# Patient Record
Sex: Female | Born: 1975 | Race: White | Hispanic: No | Smoking: Current some day smoker
Health system: Southern US, Community
[De-identification: ages and names within clinical notes are randomized; demographics above are authoritative.]

## PROBLEM LIST (undated history)

## (undated) DIAGNOSIS — M549 Dorsalgia, unspecified: Secondary | ICD-10-CM

## (undated) DIAGNOSIS — M1990 Unspecified osteoarthritis, unspecified site: Secondary | ICD-10-CM

## (undated) DIAGNOSIS — M797 Fibromyalgia: Secondary | ICD-10-CM

## (undated) DIAGNOSIS — J45909 Unspecified asthma, uncomplicated: Secondary | ICD-10-CM

## (undated) DIAGNOSIS — K219 Gastro-esophageal reflux disease without esophagitis: Secondary | ICD-10-CM

## (undated) DIAGNOSIS — G43909 Migraine, unspecified, not intractable, without status migrainosus: Secondary | ICD-10-CM

## (undated) HISTORY — DX: Migraine, unspecified, not intractable, without status migrainosus: G43.909

## (undated) HISTORY — DX: Gastro-esophageal reflux disease without esophagitis: K21.9

## (undated) HISTORY — DX: Unspecified asthma, uncomplicated: J45.909

## (undated) HISTORY — DX: Dorsalgia, unspecified: M54.9

## (undated) HISTORY — DX: Fibromyalgia: M79.7

## (undated) HISTORY — PX: OTHER SURGICAL HISTORY: SHX169

## (undated) HISTORY — DX: Unspecified osteoarthritis, unspecified site: M19.90

## (undated) HISTORY — PX: KNEE ARTHROSCOPY: SUR90

---

## 2009-07-06 ENCOUNTER — Ambulatory Visit: Payer: Self-pay | Admitting: Internal Medicine

## 2009-10-20 ENCOUNTER — Ambulatory Visit: Payer: Self-pay | Admitting: Family Medicine

## 2011-01-04 ENCOUNTER — Ambulatory Visit: Payer: Self-pay | Admitting: Radiology

## 2011-01-04 ENCOUNTER — Ambulatory Visit: Payer: Self-pay | Admitting: Family Medicine

## 2011-09-05 DIAGNOSIS — M7070 Other bursitis of hip, unspecified hip: Secondary | ICD-10-CM | POA: Insufficient documentation

## 2012-07-08 DIAGNOSIS — Z9889 Other specified postprocedural states: Secondary | ICD-10-CM | POA: Insufficient documentation

## 2012-10-10 DIAGNOSIS — M949 Disorder of cartilage, unspecified: Secondary | ICD-10-CM | POA: Insufficient documentation

## 2012-10-14 DIAGNOSIS — M7630 Iliotibial band syndrome, unspecified leg: Secondary | ICD-10-CM | POA: Insufficient documentation

## 2013-05-08 ENCOUNTER — Emergency Department: Payer: Self-pay | Admitting: Emergency Medicine

## 2013-05-11 ENCOUNTER — Emergency Department: Payer: Self-pay | Admitting: Emergency Medicine

## 2013-05-12 LAB — URINALYSIS, COMPLETE
BLOOD: NEGATIVE
Bacteria: NONE SEEN
Bilirubin,UR: NEGATIVE
GLUCOSE, UR: NEGATIVE mg/dL (ref 0–75)
Ketone: NEGATIVE
LEUKOCYTE ESTERASE: NEGATIVE
Nitrite: NEGATIVE
Ph: 5 (ref 4.5–8.0)
Protein: NEGATIVE
RBC,UR: 2 /HPF (ref 0–5)
Specific Gravity: 1.031 (ref 1.003–1.030)
Squamous Epithelial: 6
WBC UR: 1 /HPF (ref 0–5)

## 2013-07-28 ENCOUNTER — Emergency Department: Payer: Self-pay | Admitting: Emergency Medicine

## 2013-07-29 ENCOUNTER — Emergency Department: Payer: Self-pay | Admitting: Emergency Medicine

## 2013-08-12 ENCOUNTER — Ambulatory Visit: Payer: Self-pay | Admitting: Unknown Physician Specialty

## 2013-08-20 DIAGNOSIS — M9420 Chondromalacia, unspecified site: Secondary | ICD-10-CM | POA: Insufficient documentation

## 2014-05-19 ENCOUNTER — Emergency Department: Payer: Self-pay | Admitting: Emergency Medicine

## 2014-05-25 ENCOUNTER — Ambulatory Visit: Payer: Self-pay | Admitting: Family Medicine

## 2014-06-19 ENCOUNTER — Emergency Department
Admit: 2014-06-19 | Disposition: A | Discharge status: Home / Self Care | Payer: Self-pay | Admitting: Emergency Medicine

## 2014-06-24 DIAGNOSIS — F39 Unspecified mood [affective] disorder: Secondary | ICD-10-CM | POA: Insufficient documentation

## 2014-06-24 DIAGNOSIS — Z8669 Personal history of other diseases of the nervous system and sense organs: Secondary | ICD-10-CM | POA: Insufficient documentation

## 2014-06-24 DIAGNOSIS — F489 Nonpsychotic mental disorder, unspecified: Secondary | ICD-10-CM | POA: Insufficient documentation

## 2014-06-24 DIAGNOSIS — Z8719 Personal history of other diseases of the digestive system: Secondary | ICD-10-CM | POA: Insufficient documentation

## 2014-06-24 DIAGNOSIS — M545 Low back pain: Secondary | ICD-10-CM | POA: Insufficient documentation

## 2014-06-24 DIAGNOSIS — Z8739 Personal history of other diseases of the musculoskeletal system and connective tissue: Secondary | ICD-10-CM | POA: Insufficient documentation

## 2014-06-24 DIAGNOSIS — M797 Fibromyalgia: Secondary | ICD-10-CM | POA: Insufficient documentation

## 2014-06-24 DIAGNOSIS — Z8709 Personal history of other diseases of the respiratory system: Secondary | ICD-10-CM | POA: Insufficient documentation

## 2014-06-24 DIAGNOSIS — F411 Generalized anxiety disorder: Secondary | ICD-10-CM | POA: Insufficient documentation

## 2014-06-24 DIAGNOSIS — M546 Pain in thoracic spine: Secondary | ICD-10-CM

## 2014-06-24 DIAGNOSIS — G4700 Insomnia, unspecified: Secondary | ICD-10-CM | POA: Insufficient documentation

## 2014-06-27 ENCOUNTER — Emergency Department
Admit: 2014-06-27 | Disposition: A | Discharge status: Home / Self Care | Payer: Self-pay | Admitting: Physician Assistant

## 2014-08-04 ENCOUNTER — Emergency Department
Admission: EM | Admit: 2014-08-04 | Discharge: 2014-08-05 | Discharge status: Against Medical Advice | Payer: Medicaid Other | Attending: Emergency Medicine | Admitting: Emergency Medicine

## 2014-08-04 DIAGNOSIS — M25562 Pain in left knee: Secondary | ICD-10-CM | POA: Diagnosis present

## 2014-08-04 DIAGNOSIS — R200 Anesthesia of skin: Secondary | ICD-10-CM | POA: Insufficient documentation

## 2014-08-04 NOTE — ED Notes (Signed)
Pt in with co left thigh pain x 1 week.  Pt is awaiting knee replacement to left knee, called her ortho and he suggested it was related to her back.  States has numbness to outer thigh at times, denies any injury.

## 2014-09-16 ENCOUNTER — Ambulatory Visit: Payer: Self-pay | Admitting: Family Medicine

## 2014-09-16 ENCOUNTER — Encounter: Payer: Self-pay | Admitting: Family Medicine

## 2014-09-16 ENCOUNTER — Telehealth: Payer: Self-pay | Admitting: Family Medicine

## 2014-09-16 NOTE — Telephone Encounter (Signed)
Letter drafted and ready for pick up. Thank you.

## 2014-09-16 NOTE — Telephone Encounter (Signed)
Pt notified that letter was ready for pickup.

## 2014-09-16 NOTE — Telephone Encounter (Signed)
Pt called asking if she could resched appt; when told there was nothing was available, she asked if Dr. Sherley BoundsSundaram could write a letter for her probation officer explaining why she hasn't been able to find a job. Pt states that Sherley BoundsSundaram is familiar with everything going on with her and is able to best explain to her probation officer.

## 2014-11-09 ENCOUNTER — Telehealth: Payer: Self-pay | Admitting: Family Medicine

## 2014-11-09 NOTE — Telephone Encounter (Signed)
Pt has already been treated for the clot. Sorry if I confused you. She just wants to discuss with Dr Sherley Bounds but does want refill on pain meds. I will advise the pt to consult pain management about pain meds.

## 2014-11-09 NOTE — Telephone Encounter (Signed)
Pt wants to know if she can be worked in today for a office visit appt for pain meds and to discuss her situation since she had the clot. I explained to the pt that Dr Oralia Manis schedule is full today and she wants to know if she could be worked in. She states she can get her pain meds tomorrow on 11/10/14 from her pain management but feels she cannot wait til then.

## 2014-11-09 NOTE — Telephone Encounter (Signed)
After consulting with Dr. Sherley Bounds, patient should be advised to go to the ER if she thinks she has a blood clot otherwise she should follow-up with her pain clinic for pain management.

## 2014-11-17 ENCOUNTER — Ambulatory Visit: Payer: Self-pay | Admitting: Family Medicine

## 2014-11-24 ENCOUNTER — Emergency Department
Admission: EM | Admit: 2014-11-24 | Discharge: 2014-11-24 | Disposition: A | Discharge status: Home / Self Care | Payer: Medicaid Other | Attending: Emergency Medicine | Admitting: Emergency Medicine

## 2014-11-24 ENCOUNTER — Emergency Department: Payer: Medicaid Other

## 2014-11-24 ENCOUNTER — Encounter: Payer: Self-pay | Admitting: Emergency Medicine

## 2014-11-24 DIAGNOSIS — Y9389 Activity, other specified: Secondary | ICD-10-CM | POA: Insufficient documentation

## 2014-11-24 DIAGNOSIS — Z79899 Other long term (current) drug therapy: Secondary | ICD-10-CM | POA: Insufficient documentation

## 2014-11-24 DIAGNOSIS — W010XXA Fall on same level from slipping, tripping and stumbling without subsequent striking against object, initial encounter: Secondary | ICD-10-CM | POA: Diagnosis not present

## 2014-11-24 DIAGNOSIS — Z9889 Other specified postprocedural states: Secondary | ICD-10-CM | POA: Diagnosis not present

## 2014-11-24 DIAGNOSIS — S8992XA Unspecified injury of left lower leg, initial encounter: Secondary | ICD-10-CM | POA: Insufficient documentation

## 2014-11-24 DIAGNOSIS — Y998 Other external cause status: Secondary | ICD-10-CM | POA: Insufficient documentation

## 2014-11-24 DIAGNOSIS — M25562 Pain in left knee: Secondary | ICD-10-CM

## 2014-11-24 DIAGNOSIS — Y9289 Other specified places as the place of occurrence of the external cause: Secondary | ICD-10-CM | POA: Insufficient documentation

## 2014-11-24 MED ORDER — HYDROMORPHONE HCL 1 MG/ML IJ SOLN
INTRAMUSCULAR | Status: AC
Start: 2014-11-24 — End: 2014-11-24
  Administered 2014-11-24: 1 mg via INTRAMUSCULAR
  Filled 2014-11-24: qty 1

## 2014-11-24 MED ORDER — HYDROMORPHONE HCL 1 MG/ML IJ SOLN
1.0000 mg | Freq: Once | INTRAMUSCULAR | Status: AC
  Administered 2014-11-24: 1 mg via INTRAMUSCULAR

## 2014-11-24 NOTE — ED Notes (Addendum)
Pt on med hold until 2040. Pt made aware and verbalized understanding.

## 2014-11-24 NOTE — ED Notes (Signed)
Patient to ED with c/o left knee pain, reports helping with pumping gas in car and fell landing on knee that she had previously injured.

## 2014-11-24 NOTE — ED Provider Notes (Signed)
[facility] Emergency Department Provider Note  Time seen: 8:05 PM  I have reviewed the triage vital signs and the nursing notes.   HISTORY  Chief Complaint Knee Pain    HPI Sabrina Drake is a 39 y.o. female with a past medical history of asthma, back pain, migraines, fibromyalgia, multiple knee surgeries who presents the emergency department left knee pain. According to the patient patient tripped and fell landing on the left knee. 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 Patient is currently nonweightbearing. Patient states increased pain since falling and landing on the knee. Denies any other injuries. Denies hitting her head, or loss of consciousness. Describes her pain as moderate to severe, worse with any type of palpation or movement.     Past Medical History  Diagnosis Date  . Asthma   . Back pain   . Migraine headache   . Fibromyalgia   . Esophageal reflux   . Arthritis     Patient Active Problem List   Diagnosis Date Noted  . Insomnia, persistent 06/24/2014  . Anxiety, generalized 06/24/2014  . 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 Affective disorder 06/24/2014  . H/O arthritis 06/24/2014  . History of asthma 06/24/2014  . Back pain, chronic 06/24/2014  . H/O gastrointestinal disease 06/24/2014  . Fibrositis 06/24/2014  . Fibromyalgia 06/24/2014  . History of migraine headaches 06/24/2014    Past Surgical History  Procedure Laterality Date  . Knee arthroscopy      Current Outpatient Rx  Name  Route  Sig  Dispense  Refill  . ALPRAZolam (XANAX) 0.5 MG tablet   Oral   Take by mouth as needed. 2 in am, 1 in pm tablet two times daily, as needed         . DULoxetine (CYMBALTA) 60 MG capsule   Oral   Take 1 capsule by mouth daily.         Marland Kitchen gabapentin (NEURONTIN) 300 MG capsule   Oral   Take 1 capsule by mouth 3 (three) times daily.         Marland Kitchen omeprazole (PRILOSEC) 40 MG capsule  Oral   Take 1 capsule by mouth daily.         . Oxycodone HCl 10 MG TABS   Oral   Take 1 tablet by mouth every 4 (four) hours.         Marland Kitchen zolpidem (AMBIEN) 10 MG tablet   Oral   Take 1 tablet by mouth as needed. 1 tablet at bedtime, as needed           Allergies Frovatriptan; Shellfish allergy; Sumatriptan; and Zolmitriptan  Family History  Problem Relation Age of Onset  . Hypertension Mother   . Skin cancer Mother   . Heart disease Father   . Hypertension Father   . Heart disease Maternal Uncle   . Heart disease Maternal Grandfather   . Breast cancer Maternal Grandmother   . Depression Maternal Grandmother   . Stomach cancer Paternal Grandfather     Social History Social History  Substance Use Topics  . Smoking status: Never Smoker   . Smokeless tobacco: None  . Alcohol Use: No    Review of Systems Constitutional: Negative for fever. Cardiovascular: Negative for chest pain. Respiratory: Negative for shortness of breath. Gastrointestinal: Negative for abdominal pain Musculoskeletal: Positive left knee pain 10-point ROS otherwise negative.  ____________________________________________   PHYSICAL EXAM:  VITAL SIGNS: ED Triage Vitals  Enc Vitals  Group     BP 11/24/14 1856 165/100 mmHg     Pulse Rate 11/24/14 1856 83     Resp 11/24/14 1856 20     Temp 11/24/14 1856 98.1 F (36.7 C)     Temp Source 11/24/14 1856 Oral     SpO2 11/24/14 1856 100 %     Weight 11/24/14 1856 190 lb (86.183 kg)     Height 11/24/14 1856  (1.549 m)     Head Cir --      Peak Flow --      Pain Score 11/24/14 1857 9     Pain Loc --      Pain Edu? --      Excl. in GC? --     Constitutional: Alert and oriented. Well appearing and in no distress. Eyes: Normal exam ENT   Head: Normocephalic and atraumatic. Cardiovascular: Normal rate, regular rhythm. No murmur Respiratory: Normal respiratory effort without tachypnea nor retractions. Breath sounds are clear and equal  bilaterally. No wheezes/rales/rhonchi. Gastrointestinal: Soft and nontender. No distention.   Musculoskeletal: Patient with recent surgical incision to left knee, well healing. Mild to moderate tenderness of left knee and popliteal fossa. Neurovascularly intact distally with 2+ DP pulse. Neurologic:  Normal speech and language. No gross focal neurologic deficits  Skin:  Skin is warm, dry and intact. Well appearing healing surgical incision of the left knee. Psychiatric: Mood and affect are normal. Speech and behavior are normal.  ____________________________________________    RADIOLOGY  1. Ovoid subchondral sclerosis in the medial left femoral condyle with a thin surrounding rim of lucency and slight depression of the overlying articular cortex, most in keeping with an osteochondral lesion at the site of the previously demonstrated cartilage injury on the 08/12/2013 MRI study. The radiographic findings could indicate an unstable osteochondral lesion, and a follow-up left knee MRI is recommended for further evaluation. 2. Small suprapatellar knee joint effusion. Otherwise no acute fracture or malalignment in the left knee.   INITIAL IMPRESSION / ASSESSMENT AND PLAN / ED COURSE  Pertinent labs & imaging results that were available during my care of the patient were reviewed by me and considered in my medical decision making (see chart for details).  Patient with increased left knee pain after a fall. Patient is 4 weeks status post transplant of cartilage. X-ray findings are likely related to her recent cartilage transplant. Regardless I discussed these results with the patient and she is to [facility]-up [facility] tomorrow. She is agreeable to plan. We will continue nonweightbearing status. Patient takes 15 mg oxycodone every 8 hours at home already, I do not feel safe adding any additional narcotic pain medications to this regimen. We will however dose a one-time dose of IM pain  medication while under emergency department observation.  ____________________________________________   FINAL CLINICAL IMPRESSION(S) / ED DIAGNOSES  Left knee pain   Minna Antis, MD 11/24/14 2009

## 2014-11-24 NOTE — Discharge Instructions (Signed)
Please follow-up with [facility] tomorrow. Your x-ray results are as shown below: 1. Ovoid subchondral sclerosis in the medial left femoral condyle with a thin surrounding rim of lucency and slight depression of the overlying articular cortex, most in keeping with an osteochondral lesion at the site of the previously demonstrated cartilage injury on the 08/12/2013 MRI study. The radiographic findings could indicate an unstable osteochondral lesion, and a follow-up left knee MRI is recommended for further evaluation. 2. Small suprapatellar knee joint effusion. Otherwise no acute fracture or malalignment in the left knee.  Knee Pain The knee is the complex joint between your thigh and your lower leg. It is made up of bones, tendons, ligaments, and cartilage. The bones that make up the knee are:  The femur in the thigh.  The tibia and fibula in the lower leg.  The patella or kneecap riding in the groove on the lower femur. CAUSES  Knee pain is a common complaint with many causes. A few of these causes are:  Injury, such as:  A ruptured ligament or tendon injury.  Torn cartilage.  Medical conditions, such as:  Gout  Arthritis  Infections  Overuse, over training, or overdoing a physical activity. Knee pain can be minor or severe. Knee pain can accompany debilitating injury. Minor knee problems often respond well to self-care measures or get well on their own. More serious injuries may need medical intervention or even surgery. SYMPTOMS The knee is complex. Symptoms of knee problems can vary widely. Some of the problems are:  Pain with movement and weight bearing.  Swelling and tenderness.  Buckling of the knee.  Inability to straighten or extend your knee.  Your knee locks and you cannot straighten it.  Warmth and redness with pain and fever.  Deformity or dislocation of the kneecap. DIAGNOSIS  Determining what is wrong may be very straight forward such as  when there is an injury. It can also be challenging because of the complexity of the knee. Tests to make a diagnosis may include:  Your caregiver taking a history and doing a physical exam.  Routine X-rays can be used to rule out other problems. X-rays will not reveal a cartilage tear. Some injuries of the knee can be diagnosed by:  Arthroscopy a surgical technique by which a small video camera is inserted through tiny incisions on the sides of the knee. This procedure is used to examine and repair internal knee joint problems. Tiny instruments can be used during arthroscopy to repair the torn knee cartilage (meniscus).  Arthrography is a radiology technique. A contrast liquid is directly injected into the knee joint. Internal structures of the knee joint then become visible on X-ray film.  An MRI scan is a non X-ray radiology procedure in which magnetic fields and a computer produce two- or three-dimensional images of the inside of the knee. Cartilage tears are often visible using an MRI scanner. MRI scans have largely replaced arthrography in diagnosing cartilage tears of the knee.  Blood work.  Examination of the fluid that helps to lubricate the knee joint (synovial fluid). This is done by taking a sample out using a needle and a syringe. TREATMENT The treatment of knee problems depends on the cause. Some of these treatments are:  Depending on the injury, proper casting, splinting, surgery, or physical therapy care will be needed.  Give yourself adequate recovery time. Do not overuse your joints. If you begin to get sore during workout routines, back off. Slow down or  do fewer repetitions.  For repetitive activities such as cycling or running, maintain your strength and nutrition.  Alternate muscle groups. For example, if you are a weight lifter, work the upper body on one day and the lower body the next.  Either tight or weak muscles do not give the proper support for your knee. Tight  or weak muscles do not absorb the stress placed on the knee joint. Keep the muscles surrounding the knee strong.  Take care of mechanical problems.  If you have flat feet, orthotics or special shoes may help. See your caregiver if you need help.  Arch supports, sometimes with wedges on the inner or outer aspect of the heel, can help. These can shift pressure away from the side of the knee most bothered by osteoarthritis.  A brace called an "unloader" brace also may be used to help ease the pressure on the most arthritic side of the knee.  If your caregiver has prescribed crutches, braces, wraps or ice, use as directed. The acronym for this is PRICE. This means protection, rest, ice, compression, and elevation.  Nonsteroidal anti-inflammatory drugs (NSAIDs), can help relieve pain. But if taken immediately after an injury, they may actually increase swelling. Take NSAIDs with food in your stomach. Stop them if you develop stomach problems. Do not take these if you have a history of ulcers, stomach pain, or bleeding from the bowel. Do not take without your caregiver's approval if you have problems with fluid retention, heart failure, or kidney problems.  For ongoing knee problems, physical therapy may be helpful.  Glucosamine and chondroitin are over-the-counter dietary supplements. Both may help relieve the pain of osteoarthritis in the knee. These medicines are different from the usual anti-inflammatory drugs. Glucosamine may decrease the rate of cartilage destruction.  Injections of a corticosteroid drug into your knee joint may help reduce the symptoms of an arthritis flare-up. They may provide pain relief that lasts a few months. You may have to wait a few months between injections. The injections do have a small increased risk of infection, water retention, and elevated blood sugar levels.  Hyaluronic acid injected into damaged joints may ease pain and provide lubrication. These injections may  work by reducing inflammation. A series of shots may give relief for as long as 6 months.  Topical painkillers. Applying certain ointments to your skin may help relieve the pain and stiffness of osteoarthritis. Ask your pharmacist for suggestions. Many over the-counter products are approved for temporary relief of arthritis pain.  In some countries, doctors often prescribe topical NSAIDs for relief of chronic conditions such as arthritis and tendinitis. A review of treatment with NSAID creams found that they worked as well as oral medications but without the serious side effects. PREVENTION  Maintain a healthy weight. Extra pounds put more strain on your joints.  Get strong, stay limber. Weak muscles are a common cause of knee injuries. Stretching is important. Include flexibility exercises in your workouts.  Be smart about exercise. If you have osteoarthritis, chronic knee pain or recurring injuries, you may need to change the way you exercise. This does not mean you have to stop being active. If your knees ache after jogging or playing basketball, consider switching to swimming, water aerobics, or other low-impact activities, at least for a few days a week. Sometimes limiting high-impact activities will provide relief.  Make sure your shoes fit well. Choose footwear that is right for your sport.  Protect your knees. Use the proper gear  for knee-sensitive activities. Use kneepads when playing volleyball or laying carpet. Buckle your seat belt every time you drive. Most shattered kneecaps occur in car accidents.  Rest when you are tired. SEEK MEDICAL CARE IF:  You have knee pain that is continual and does not seem to be getting better.  SEEK IMMEDIATE MEDICAL CARE IF:  Your knee joint feels hot to the touch and you have a high fever. MAKE SURE YOU:   Understand these instructions.  Will watch your condition.  Will get help right away if you are not doing well or get worse. Document  Released: 01/01/2007 Document Revised: 05/29/2011 Document Reviewed: 01/01/2007 [facility] Patient Information 2015 [place], [place]. This information is not intended to replace advice given to you by your health care provider. Make sure you discuss any questions you have with your health care provider.

## 2014-11-24 NOTE — ED Notes (Signed)
Pt requesting more pain meds at this time. RN spoke to MD who verbalized no further orders at this time

## 2014-12-29 ENCOUNTER — Encounter: Payer: Self-pay | Admitting: Emergency Medicine

## 2014-12-29 ENCOUNTER — Emergency Department: Payer: Medicaid Other

## 2014-12-29 ENCOUNTER — Emergency Department
Admission: EM | Admit: 2014-12-29 | Discharge: 2014-12-29 | Disposition: A | Discharge status: Home / Self Care | Payer: Medicaid Other | Attending: Emergency Medicine | Admitting: Emergency Medicine

## 2014-12-29 DIAGNOSIS — Z79899 Other long term (current) drug therapy: Secondary | ICD-10-CM | POA: Insufficient documentation

## 2014-12-29 DIAGNOSIS — Y92009 Unspecified place in unspecified non-institutional (private) residence as the place of occurrence of the external cause: Secondary | ICD-10-CM | POA: Diagnosis not present

## 2014-12-29 DIAGNOSIS — Y9389 Activity, other specified: Secondary | ICD-10-CM | POA: Diagnosis not present

## 2014-12-29 DIAGNOSIS — Y998 Other external cause status: Secondary | ICD-10-CM | POA: Diagnosis not present

## 2014-12-29 DIAGNOSIS — W01198A Fall on same level from slipping, tripping and stumbling with subsequent striking against other object, initial encounter: Secondary | ICD-10-CM | POA: Insufficient documentation

## 2014-12-29 DIAGNOSIS — S8992XA Unspecified injury of left lower leg, initial encounter: Secondary | ICD-10-CM | POA: Diagnosis present

## 2014-12-29 DIAGNOSIS — S8002XA Contusion of left knee, initial encounter: Secondary | ICD-10-CM | POA: Insufficient documentation

## 2014-12-29 DIAGNOSIS — Z79891 Long term (current) use of opiate analgesic: Secondary | ICD-10-CM | POA: Diagnosis not present

## 2014-12-29 MED ORDER — OXYCODONE-ACETAMINOPHEN 5-325 MG PO TABS
2.0000 | ORAL_TABLET | Freq: Once | ORAL | Status: AC
  Administered 2014-12-29: 2 via ORAL
  Filled 2014-12-29: qty 2

## 2014-12-29 NOTE — ED Notes (Signed)
Patient discharged to home per MD order. Patient in stable condition, and deemed medically cleared by ED provider for discharge. Discharge instructions reviewed with patient/family using "Teach Back"; verbalized understanding of medication education and administration, and information about follow-up care. Denies further concerns. ° °

## 2014-12-29 NOTE — ED Provider Notes (Signed)
[facility] Emergency Department Provider Note ____________________________________________  Time seen: 2310  I have reviewed the triage vital signs and the nursing notes.  HISTORY  Chief Complaint  Knee Pain  HPI Sabrina Drake is a 39 y.o. female reports to the ED for evaluation and management of pain to the left knee after a fall this morning. She describes she was at home, and tripped over a rug, causing her to fall twisting the left knee and then landed on it. She does report to wearing a Velcro knee sleeve as she is 8 weeks status post surgery to the left knee. 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 She notes the knee has been "catching" since the injury. She rates her pain at 8/10 in triage. She is on daily pain medicines which include oxycodone 10 mg and OxyContin 15 mg. She claims that she has recently left her pain pills at her parents house and may have been. She claims she is not able to retrieve those pills today. Review of the [place] controlled substance database, reveals that she received a refill of #100 oxycodone on 12/22/14 and #60 OxyContin on 11/21/14, from her provider.  Past Medical History  Diagnosis Date  . Asthma   . Back pain   . Migraine headache   . Fibromyalgia   . Esophageal reflux   . Arthritis     Patient Active Problem List   Diagnosis Date Noted  . Insomnia, persistent 06/24/2014  . Anxiety, generalized 06/24/2014  . 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 Affective disorder (HCC) 06/24/2014  . H/O arthritis 06/24/2014  . History of asthma 06/24/2014  . Back pain, chronic 06/24/2014  . H/O gastrointestinal disease 06/24/2014  . Fibrositis 06/24/2014  . Fibromyalgia 06/24/2014  . History of migraine headaches 06/24/2014    Past Surgical History  Procedure Laterality Date  . Knee arthroscopy      Current Outpatient Rx  Name  Route   Sig  Dispense  Refill  . ALPRAZolam (XANAX) 0.5 MG tablet   Oral   Take by mouth as needed. 2 in am, 1 in pm tablet two times daily, as needed         . DULoxetine (CYMBALTA) 60 MG capsule   Oral   Take 1 capsule by mouth daily.         Marland Kitchen gabapentin (NEURONTIN) 300 MG capsule   Oral   Take 1 capsule by mouth 3 (three) times daily.         Marland Kitchen omeprazole (PRILOSEC) 40 MG capsule   Oral   Take 1 capsule by mouth daily.         . Oxycodone HCl 10 MG TABS   Oral   Take 1 tablet by mouth every 4 (four) hours.         Marland Kitchen zolpidem (AMBIEN) 10 MG tablet   Oral   Take 1 tablet by mouth as needed. 1 tablet at bedtime, as needed           Allergies Frovatriptan; Shellfish allergy; Sumatriptan; Toradol; Tramadol; and Zolmitriptan  Family History  Problem Relation Age of Onset  . Hypertension Mother   . Skin cancer Mother   . Heart disease Father   . Hypertension Father   . Heart disease Maternal Uncle   . Heart disease Maternal Grandfather   . Breast cancer Maternal Grandmother   .  Depression Maternal Grandmother   . Stomach cancer Paternal Grandfather    Social History Social History  Substance Use Topics  . Smoking status: Never Smoker   . Smokeless tobacco: None  . Alcohol Use: No   Review of Systems  Constitutional: Negative for fever. Eyes: Negative for visual changes. ENT: Negative for sore throat. Cardiovascular: Negative for chest pain. Respiratory: Negative for shortness of breath. Gastrointestinal: Negative for abdominal pain, vomiting and diarrhea. Genitourinary: Negative for dysuria. Musculoskeletal: Negative for back pain. Left knee pain as above. Skin: Negative for rash. Neurological: Negative for headaches, focal weakness or numbness. ____________________________________________  PHYSICAL EXAM:  VITAL SIGNS: ED Triage Vitals  Enc Vitals Group     BP 12/29/14 2256 146/82 mmHg     Pulse Rate 12/29/14 2256 71     Resp 12/29/14 2256 20      Temp 12/29/14 2256 97.8 F (36.6 C)     Temp src --      SpO2 12/29/14 2256 100 %     Weight 12/29/14 2256 200 lb (90.719 kg)     Height 12/29/14 2256  (1.575 m)     Head Cir --      Peak Flow --      Pain Score 12/29/14 2308 8     Pain Loc --      Pain Edu? --      Excl. in GC? --    Constitutional: Alert and oriented. Well appearing and in no distress. Head: Normocephalic and atraumatic.      Eyes: Conjunctivae are normal. PERRL. Normal extraocular movements      Ears: Canals clear. TMs intact bilaterally.   Nose: No congestion/rhinorrhea.   Mouth/Throat: Mucous membranes are moist.   Neck: Supple. No thyromegaly. Hematological/Lymphatic/Immunological: No cervical lymphadenopathy. Cardiovascular: Normal rate, regular rhythm. Normal distal pulses Respiratory: Normal respiratory effort. No wheezes/rales/rhonchi. Gastrointestinal: Soft and nontender. No distention. Musculoskeletal: Left knee with well-healed medial surgical scar without local abrasion, edema, or erythema. Patient with full active range of motion of the left knee with flexion and extension. Nontender with normal range of motion in all extremities.  Neurologic:  Normal gait without ataxia. Normal speech and language. No gross focal neurologic deficits are appreciated. Skin:  Skin is warm, dry and intact. No rash noted. 1+ pitting edema bilaterally from the ankle. Psychiatric: Mood and affect are normal. Patient exhibits appropriate insight and judgment. ____________________________________________   RADIOLOGY Left Knee IMPRESSION: Medial femoral condyle osteochondral defect re- demonstrated. No new osseous abnormality identified at the left knee.  I, [name], [name], personally viewed and evaluated these images (plain radiographs) as part of my medical decision making.  ____________________________________________  PROCEDURES  Roxicet 5-325 mg 2 tabs  PO ____________________________________________  INITIAL IMPRESSION / ASSESSMENT AND PLAN / ED COURSE  Patient with a left knee contusion, status post left knee surgery. No radiographic evidence of fracture, effusion or dislocation. Patient will be discharged with instructions to dose her home medications as previously prescribed. She is to rest, ice, elevate the knee and wear her knee brace as appropriate. Follow-up with orthopedics provider as scheduled.  ____________________________________________  FINAL CLINICAL IMPRESSION(S) / ED DIAGNOSES  Final diagnoses:  Knee contusion, left, initial encounter      Lissa Hoard, PA-C 12/29/14 2347  Jennye Moccasin, MD 01/04/15 [phone]

## 2014-12-29 NOTE — ED Notes (Signed)
Pt presents to ED with left knee brace in which she had left knee surgery 10/26/2014.  Pt tripped today and knee has been "catching" since. 8/10 pain

## 2014-12-29 NOTE — ED Notes (Signed)
Pt in with co left knee pain, states she fell this am onto knee.  Recently had 3rd surgery to same knee in august, and since fall states she feels a "popping" and has increased pain.

## 2014-12-29 NOTE — Discharge Instructions (Signed)
° °  Your exam and x-rays are unchanged from last month. Continue to dose your home medicines for pain relief.  Follow-up with Dr. Regenia Skeeter as scheduled for further evaluation. Apply ice and rest with the leg elevated to reduce pain and swelling.

## 2015-01-04 ENCOUNTER — Emergency Department
Admission: EM | Admit: 2015-01-04 | Discharge: 2015-01-04 | Disposition: A | Discharge status: Home / Self Care | Payer: Medicaid Other | Attending: Emergency Medicine | Admitting: Emergency Medicine

## 2015-01-04 ENCOUNTER — Encounter: Payer: Self-pay | Admitting: Emergency Medicine

## 2015-01-04 DIAGNOSIS — Z79899 Other long term (current) drug therapy: Secondary | ICD-10-CM | POA: Diagnosis not present

## 2015-01-04 DIAGNOSIS — K0889 Other specified disorders of teeth and supporting structures: Secondary | ICD-10-CM | POA: Diagnosis present

## 2015-01-04 DIAGNOSIS — Z792 Long term (current) use of antibiotics: Secondary | ICD-10-CM | POA: Insufficient documentation

## 2015-01-04 DIAGNOSIS — K047 Periapical abscess without sinus: Secondary | ICD-10-CM

## 2015-01-04 MED ORDER — OXYCODONE-ACETAMINOPHEN 5-325 MG PO TABS
1.0000 | ORAL_TABLET | Freq: Once | ORAL | Status: AC
  Administered 2015-01-04: 1 via ORAL
  Filled 2015-01-04: qty 1

## 2015-01-04 MED ORDER — AMOXICILLIN-POT CLAVULANATE 875-125 MG PO TABS: 1.0000 | ORAL_TABLET | Freq: Two times a day (BID) | ORAL | Status: DC

## 2015-01-04 NOTE — ED Provider Notes (Signed)
[facility] Emergency Department Provider Note  ____________________________________________  Time seen: Approximately 7:30 PM  I have reviewed the triage vital signs and the nursing notes.   HISTORY  Chief Complaint Dental Pain    HPI Sabrina Drake is a 39 y.o. female presents to the emergency department with a known dental abscess to second molar left lower jaw. She states she was evaluated by her dentist was placed on amoxicillin for that infection" is getting worse". She states that she tried calling back to her dentist and was unable to receive a response this afternoon for new antibiotic. Posterior have a root canal done but states that she is on blood thinners and has to have medical clearance prior to that procedure being done. States that she wants and a change in her antibiotics.   Of note, the patient was queried in the [place] controlled substance database. Database reveals that in the last 2 weeks she has filled prescriptions for OxyContin No. 60 on 12/30/2014 and oxycodone No. 100 on 12/22/2014.   Past Medical History  Diagnosis Date  . Asthma   . Back pain   . Migraine headache   . Fibromyalgia   . Esophageal reflux   . Arthritis     Patient Active Problem List   Diagnosis Date Noted  . Insomnia, persistent 06/24/2014  . Anxiety, generalized 06/24/2014  . 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 Affective disorder (HCC) 06/24/2014  . H/O arthritis 06/24/2014  . History of asthma 06/24/2014  . Back pain, chronic 06/24/2014  . H/O gastrointestinal disease 06/24/2014  . Fibrositis 06/24/2014  . Fibromyalgia 06/24/2014  . History of migraine headaches 06/24/2014    Past Surgical History  Procedure Laterality Date  . Knee arthroscopy      Current Outpatient Rx  Name  Route  Sig  Dispense  Refill  . ALPRAZolam (XANAX) 0.5 MG tablet   Oral   Take by mouth as needed. 2 in am, 1 in pm tablet two times daily, as needed          . amoxicillin-clavulanate (AUGMENTIN) 875-125 MG tablet   Oral   Take 1 tablet by mouth 2 (two) times daily.   14 tablet   0   . DULoxetine (CYMBALTA) 60 MG capsule   Oral   Take 1 capsule by mouth daily.         Marland Kitchen gabapentin (NEURONTIN) 300 MG capsule   Oral   Take 1 capsule by mouth 3 (three) times daily.         Marland Kitchen omeprazole (PRILOSEC) 40 MG capsule   Oral   Take 1 capsule by mouth daily.         . Oxycodone HCl 10 MG TABS   Oral   Take 1 tablet by mouth every 4 (four) hours.         Marland Kitchen zolpidem (AMBIEN) 10 MG tablet   Oral   Take 1 tablet by mouth as needed. 1 tablet at bedtime, as needed           Allergies Frovatriptan; Shellfish allergy; Sumatriptan; Toradol; Tramadol; and Zolmitriptan  Family History  Problem Relation Age of Onset  . Hypertension Mother   . Skin cancer Mother   . Heart disease Father   . Hypertension Father   . Heart disease Maternal Uncle   . Heart disease Maternal Grandfather   . Breast cancer Maternal Grandmother   . Depression Maternal Grandmother   . Stomach cancer Paternal Grandfather  Social History Social History  Substance Use Topics  . Smoking status: Never Smoker   . Smokeless tobacco: None  . Alcohol Use: No    Review of Systems Constitutional: No fever/chills Eyes: No visual changes. ENT: No sore throat. The patient endorses left-sided lower dental pain. Cardiovascular: Denies chest pain. Respiratory: Denies shortness of breath. Gastrointestinal: No abdominal pain.  No nausea, no vomiting.  No diarrhea.  No constipation. Genitourinary: Negative for dysuria. Musculoskeletal: Negative for back pain. Skin: Negative for rash. Neurological: Negative for headaches, focal weakness or numbness.  10-point ROS otherwise negative.  ____________________________________________   PHYSICAL EXAM:  VITAL SIGNS: ED Triage Vitals  Enc Vitals Group     BP 01/04/15 1842 133/91 mmHg     Pulse Rate 01/04/15 1842  72     Resp 01/04/15 1842 18     Temp 01/04/15 1842 98.4 F (36.9 C)     Temp Source 01/04/15 1842 Oral     SpO2 01/04/15 1842 99 %     Weight 01/04/15 1842 195 lb (88.451 kg)     Height 01/04/15 1842  (1.575 m)     Head Cir --      Peak Flow --      Pain Score 01/04/15 1844 7     Pain Loc --      Pain Edu? --      Excl. in GC? --     Constitutional: Alert and oriented. Well appearing and in no acute distress. Eyes: Conjunctivae are normal. PERRL. EOMI. Head: Atraumatic. Nose: No congestion/rhinnorhea. Mouth/Throat: Mucous membranes are moist.  Oropharynx non-erythematous. There is erythema and edema surrounding the second molar on the left lower jaw. Drainage noted. No loose dentition. Neck: No stridor.   Hematological/Lymphatic/Immunilogical: No cervical lymphadenopathy. Cardiovascular: Normal rate, regular rhythm. Grossly normal heart sounds.  Good peripheral circulation. Respiratory: Normal respiratory effort.  No retractions. Lungs CTAB. Gastrointestinal: Soft and nontender. No distention. No abdominal bruits. No CVA tenderness. Musculoskeletal: No lower extremity tenderness nor edema.  No joint effusions. Neurologic:  Normal speech and language. No gross focal neurologic deficits are appreciated. No gait instability. Skin:  Skin is warm, dry and intact. No rash noted. Psychiatric: Mood and affect are normal. Speech and behavior are normal.  ____________________________________________   LABS (all labs ordered are listed, but only abnormal results are displayed)  Labs Reviewed - No data to display ____________________________________________  EKG   ____________________________________________  RADIOLOGY   ____________________________________________   PROCEDURES  Procedure(s) performed: None  Critical Care performed: No  ____________________________________________   INITIAL IMPRESSION / ASSESSMENT AND PLAN / ED COURSE  Pertinent labs & imaging  results that were available during my care of the patient were reviewed by me and considered in my medical decision making (see chart for details).  The patient presents emergency Department with a known dental abscess and is waiting for medical clearance from her PCP for root canal. She states that the antibiotic she is placed on, amoxicillin, has been ineffective in treating the infection. He tried calling her dentist with no answer today. I will change the patient's antibiotic from amoxicillin to Augmentin and explained the differences with the patient. She verbalizes understanding of same. I will give 1 dose of narcotic pain medication in the emergency department for pain but advised the patient I would not send her home with any pain medications. The patient verbalized understanding of same. ____________________________________________   FINAL CLINICAL IMPRESSION(S) / ED DIAGNOSES  Final diagnoses:  Dental abscess  Racheal PatchesJonathan D Cuthriell, PA-C 01/04/15 1945  Myrna Blazeravid Matthew Schaevitz, MD 01/04/15 [phone]

## 2015-01-04 NOTE — Discharge Instructions (Signed)

## 2015-01-04 NOTE — ED Notes (Signed)
States L sided dental pain, cannot have tooth pulled until done with course of Xaralto for DVT

## 2015-01-13 ENCOUNTER — Encounter (INDEPENDENT_AMBULATORY_CARE_PROVIDER_SITE_OTHER): Payer: Self-pay

## 2015-01-13 ENCOUNTER — Encounter: Payer: Self-pay | Admitting: Family Medicine

## 2015-01-13 ENCOUNTER — Ambulatory Visit (INDEPENDENT_AMBULATORY_CARE_PROVIDER_SITE_OTHER): Payer: Medicaid Other | Admitting: Family Medicine

## 2015-01-13 VITALS — BP 126/78 | HR 63 | Temp 98.4°F | Resp 18 | Wt 203.4 lb

## 2015-01-13 DIAGNOSIS — I82402 Acute embolism and thrombosis of unspecified deep veins of left lower extremity: Secondary | ICD-10-CM

## 2015-01-13 DIAGNOSIS — K047 Periapical abscess without sinus: Secondary | ICD-10-CM | POA: Insufficient documentation

## 2015-01-13 DIAGNOSIS — M545 Low back pain: Secondary | ICD-10-CM

## 2015-01-13 DIAGNOSIS — Z86718 Personal history of other venous thrombosis and embolism: Secondary | ICD-10-CM | POA: Insufficient documentation

## 2015-01-13 DIAGNOSIS — M546 Pain in thoracic spine: Secondary | ICD-10-CM

## 2015-01-13 NOTE — Progress Notes (Signed)
Name: Sabrina Drake   MRN: [id number]    DOB: 01-23-1976   Date:01/13/2015       Progress Note  Subjective  Chief Complaint  Chief Complaint  Patient presents with  . Medical Clearance    patient is here to get clearance for a tooth extraction.   . Advice Only    patient needs MRI    HPI  Sabrina Drake is a 39 y.o. female with a past medical history of back pain, migraines, fibromyalgia, multiple knee surgeries, history of incarceration in 2015, opioid dependency presents today with a known dental abscess to second molar left lower jaw requiring surgical clearance prior to dental procedure. She states she was evaluated by her dentist was placed on amoxicillin for that infection. Posterior have a root canal done but states that she is on blood thinners and has to have medical clearance prior to that procedure being done. 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 Madelaine EtienneSolic. On 11/01/14 a doppler US of LE was done showing:  Summary of Findings  Right No evidence of deep venous obstruction in the lower extremity. No indirect evidence of obstruction proximal to the inguinal ligament.  Left Evidence of acute obstruction in the posterior tibial vein and peroneal vein.  She was started on Xarelto 20 mg a day end of August 2016 and has been on it for 2 months now, proposed duration 3-6 months. But today she is willing to come off of the medication for advised time to proceed with much needed dental procedure as she has had recurrent oral infections for over 1 month now.  Of note, the patient was [place] the [place] controlled substance [place]. Database reveals that in the last 2 weeks she has filled prescriptions for Oxycodone 10mg  No. 12 on 01/12/15 and Oxycontin 15mg  No. 60 on 12/30/2014 and Oxycodone 10mg  No. 100 on 12/22/2014. Prescribed by pain management specialist, Dr. Rochele RaringSunil Dogra. She is due to  follow up with him soon and is going to discuss her chronic thoracic and lumbar back pain. She complains of muscle stiffness and more weakness in her legs with occasional numbness over the past few months and is requesting an MRI of her thoracic and lumbar spine today. She is interested in pursuing neurosurgical consultation if MRI findings indicate the need.  Past Medical History  Diagnosis Date  . Asthma   . Back pain   . Migraine headache   . Fibromyalgia   . Esophageal reflux   . Arthritis     Patient Active Problem List   Diagnosis Date Noted  . Dental abscess 01/13/2015  . DVT (deep venous thrombosis), left 01/13/2015  . Insomnia, persistent 06/24/2014  . Anxiety, generalized 06/24/2014  . 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 Affective disorder (HCC) 06/24/2014  . H/O arthritis 06/24/2014  . History of asthma 06/24/2014  . Thoracolumbar back pain 06/24/2014  . H/O gastrointestinal disease 06/24/2014  . Fibrositis 06/24/2014  . Fibromyalgia 06/24/2014  . History of migraine headaches 06/24/2014  . 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 Iliotibial band syndrome 10/14/2012  . Cartilage disease 10/10/2012  . Gonalgia 07/08/2012  . Bursitis of hip 09/05/2011    Social History  Substance Use Topics  . Smoking status: Never Smoker   . Smokeless tobacco: Not on file  . Alcohol Use: No     Current outpatient prescriptions:  .  ALPRAZolam (XANAX) 0.5 MG tablet, Take by mouth as needed. 2 in  am, 1 in pm tablet two times daily, as needed, Disp: , Rfl:  .  DULoxetine (CYMBALTA) 60 MG capsule, Take 1 capsule by mouth daily., Disp: , Rfl:  .  LYRICA 75 MG capsule, Take 75 mg by mouth 2 (two) times daily., Disp: , Rfl: 5 .  omeprazole (PRILOSEC) 40 MG capsule, Take 1 capsule by mouth daily., Disp: , Rfl:  .  Oxycodone HCl 10 MG TABS, Take 1 tablet by mouth every 4 (four) hours., Disp: , Rfl:  .  OXYCONTIN 15 MG T12A 12 hr tablet, Take 15 mg by mouth 2 (two) times daily., Disp: , Rfl: 0 .   XARELTO 20 MG TABS tablet, Take 1 tablet by mouth daily., Disp: , Rfl: 2 .  zolpidem (AMBIEN) 10 MG tablet, Take 1 tablet by mouth as needed. 1 tablet at bedtime, as needed, Disp: , Rfl:   Past Surgical History  Procedure Laterality Date  . Knee arthroscopy      Family History  Problem Relation Age of Onset  . Hypertension Mother   . Skin cancer Mother   . Heart disease Father   . Hypertension Father   . Heart disease Maternal Uncle   . Heart disease Maternal Grandfather   . Breast cancer Maternal Grandmother   . Depression Maternal Grandmother   . Stomach cancer Paternal Grandfather     Allergies  Allergen Reactions  . Frovatriptan   . Shellfish Allergy Nausea And Vomiting  . Sumatriptan   . Toradol [Ketorolac Tromethamine] Swelling  . Tramadol Nausea And Vomiting  . Zolmitriptan      Review of Systems  CONSTITUTIONAL: No significant weight changes, fever, chills, weakness or fatigue.  HEENT:  - Eyes: No visual changes.  - Ears: No auditory changes. No pain.  - Nose: No sneezing, congestion, runny nose. - Throat: No sore throat. No changes in swallowing. Yes dental pain. SKIN: No rash or itching.  CARDIOVASCULAR: No chest pain, chest pressure or chest discomfort. No palpitations or edema.  RESPIRATORY: No shortness of breath, cough or sputum.  MUSCULOSKELETAL: Chronic joint pain. No muscle pain. HEMATOLOGIC: No anemia, bleeding or bruising.  LYMPHATICS: No enlarged lymph nodes.  PSYCHIATRIC: No change in mood. No change in sleep pattern.      Objective  BP 126/78 mmHg  Pulse 63  Temp(Src) 98.4 F (36.9 C) (Oral)  Resp 18  Wt 203 lb 6.4 oz (92.262 kg)  SpO2 97% Body mass index is 37.19 kg/(m^2).  Physical Exam  Constitutional: Patient is obese and well-nourished. In no distress.  HEENT:  - Head: Normocephalic and atraumatic.  - Ears: Bilateral TMs gray, no erythema or effusion - Nose: Nasal mucosa moist - Mouth/Throat: Oropharynx is clear and  moist. No tonsillar hypertrophy or erythema. No post nasal drainage. Dental cavities noted with fillings in place, no gingival hyperplasia or abscess noted on today's exam. - Eyes: Conjunctivae clear, EOM movements normal. PERRLA. No scleral icterus.  Neck: Normal range of motion. Neck supple. No JVD present. No thyromegaly present.  Cardiovascular: Normal rate, regular rhythm and normal heart sounds.  No murmur heard.  Pulmonary/Chest: Effort normal and breath sounds normal. No respiratory distress. Spine: Normal curvature of spine with no palpable step off. Some paraspinal muscle tension noted.  Left knee: Well head medal aspect surgical scar with no joint effusion or erythema. No calf swelling, tenderness, erythema. Psychiatric: Patient has a normal mood and affect. Behavior is normal in office today. Judgment and thought content normal in office today.  Assessment & Plan  1. Dental abscess EKG shows NSR. May proceed with dental procedure after discontinuing Xarelto 48hrs prior to and post procedure. The patient has verbally expressed understanding the increased risk of reoccurrence of DVT with or without PE and also the increased risk of bleeding when reinstating Xarelto post procedure. She insists on proceeding with dental procedure and not waiting another 1 month (total 3 months of anticoagulation) and discontinuing anticoagulation all together.  - EKG 12-Lead  2. DVT (deep venous thrombosis), left See above note.  - EKG 12-Lead  3. Thoracolumbar back pain We discussed potential pathology and long term risk of reoccurrence. Maintaining an ideal body habitus, regular exercise (which is difficult for her due to left knee issues), proper lifting techniques and mindfulness of exacerbating factors will be useful in long term management.  Instructed on use of heating pad with exercises. Consider concomitant therapy with PT, massage therapist or chiropractor. Continue follow up with pain  management specialist. I will proceed with MRI imaging and appropriate referral thereafter.  - MR Thoracic Spine Wo Contrast; Future - MR Lumbar Spine Wo Contrast; Future

## 2015-01-24 ENCOUNTER — Emergency Department
Admission: EM | Admit: 2015-01-24 | Discharge: 2015-01-24 | Disposition: A | Discharge status: Home / Self Care | Payer: Medicaid Other | Attending: Emergency Medicine | Admitting: Emergency Medicine

## 2015-01-24 ENCOUNTER — Emergency Department: Payer: Medicaid Other

## 2015-01-24 ENCOUNTER — Encounter: Payer: Self-pay | Admitting: Emergency Medicine

## 2015-01-24 DIAGNOSIS — S39012A Strain of muscle, fascia and tendon of lower back, initial encounter: Secondary | ICD-10-CM

## 2015-01-24 DIAGNOSIS — Y93E1 Activity, personal bathing and showering: Secondary | ICD-10-CM | POA: Insufficient documentation

## 2015-01-24 DIAGNOSIS — Y9289 Other specified places as the place of occurrence of the external cause: Secondary | ICD-10-CM | POA: Diagnosis not present

## 2015-01-24 DIAGNOSIS — S86912A Strain of unspecified muscle(s) and tendon(s) at lower leg level, left leg, initial encounter: Secondary | ICD-10-CM

## 2015-01-24 DIAGNOSIS — Z79899 Other long term (current) drug therapy: Secondary | ICD-10-CM | POA: Insufficient documentation

## 2015-01-24 DIAGNOSIS — Z79891 Long term (current) use of opiate analgesic: Secondary | ICD-10-CM | POA: Insufficient documentation

## 2015-01-24 DIAGNOSIS — S86812A Strain of other muscle(s) and tendon(s) at lower leg level, left leg, initial encounter: Secondary | ICD-10-CM | POA: Insufficient documentation

## 2015-01-24 DIAGNOSIS — Z7901 Long term (current) use of anticoagulants: Secondary | ICD-10-CM | POA: Diagnosis not present

## 2015-01-24 DIAGNOSIS — S3992XA Unspecified injury of lower back, initial encounter: Secondary | ICD-10-CM | POA: Diagnosis present

## 2015-01-24 DIAGNOSIS — W010XXA Fall on same level from slipping, tripping and stumbling without subsequent striking against object, initial encounter: Secondary | ICD-10-CM | POA: Diagnosis not present

## 2015-01-24 DIAGNOSIS — Y998 Other external cause status: Secondary | ICD-10-CM | POA: Insufficient documentation

## 2015-01-24 MED ORDER — OXYCODONE-ACETAMINOPHEN 5-325 MG PO TABS
2.0000 | ORAL_TABLET | Freq: Once | ORAL | Status: AC
  Administered 2015-01-24: 2 via ORAL

## 2015-01-24 MED ORDER — OXYCODONE-ACETAMINOPHEN 5-325 MG PO TABS
ORAL_TABLET | ORAL | Status: AC
  Administered 2015-01-24: 2 via ORAL
  Filled 2015-01-24: qty 2

## 2015-01-24 NOTE — ED Notes (Signed)
System downtime occurred.  RN assessment on paper chart.

## 2015-01-24 NOTE — ED Notes (Signed)
Reports getting out of shower and slipped and fell.  Patient complains of mid to lower back pain and left knee pain.

## 2015-01-24 NOTE — Discharge Instructions (Signed)
You have been seen in the Emergency Department (ED)  today for back pain.  Your workup and exam have not shown any acute abnormalities and you are likely suffering from muscle strain or possible problems with your discs, but there is no treatment that will fix your symptoms at this time.  Please take Motrin (ibuprofen) as needed for your pain according to the instructions written on the box.  Alternatively, for the next five days you can take 600mg  three times daily with meals (it may upset your stomach).  Take your regular prescription pain medication as written.  Please follow up with your doctor as soon as possible regarding today's ED visit and your back pain.  Return to the ED for worsening back pain, fever, weakness or numbness of either leg, or if you develop either (1) an inability to urinate or have bowel movements, or (2) loss of your ability to control your bathroom functions (if you start having "accidents"), or if you develop other new symptoms that concern you.   Low Back Strain With Rehab A strain is an injury in which a tendon or muscle is torn. The muscles and tendons of the lower back are vulnerable to strains. However, these muscles and tendons are very strong and require a great force to be injured. Strains are classified into three categories. Grade 1 strains cause pain, but the tendon is not lengthened. Grade 2 strains include a lengthened ligament, due to the ligament being stretched or partially ruptured. With grade 2 strains there is still function, although the function may be decreased. Grade 3 strains involve a complete tear of the tendon or muscle, and function is usually impaired. SYMPTOMS   Pain in the lower back.  Pain that affects one side more than the other.  Pain that gets worse with movement and may be felt in the hip, buttocks, or back of the thigh.  Muscle spasms of the muscles in the back.  Swelling along the muscles of the back.  Loss of strength of the  back muscles.  Crackling sound (crepitation) when the muscles are touched. CAUSES  Lower back strains occur when a force is placed on the muscles or tendons that is greater than they can handle. Common causes of injury include:  Prolonged overuse of the muscle-tendon units in the lower back, usually from incorrect posture.  A single violent injury or force applied to the back. RISK INCREASES WITH:  Sports that involve twisting forces on the spine or a lot of bending at the waist (football, rugby, weightlifting, bowling, golf, tennis, speed skating, racquetball, swimming, running, gymnastics, diving).  Poor strength and flexibility.  Failure to warm up properly before activity.  Family history of lower back pain or disk disorders.  Previous back injury or surgery (especially fusion).  Poor posture with lifting, especially heavy objects.  Prolonged sitting, especially with poor posture. PREVENTION   Learn and use proper posture when sitting or lifting (maintain proper posture when sitting, lift using the knees and legs, not at the waist).  Warm up and stretch properly before activity.  Allow for adequate recovery between workouts.  Maintain physical fitness:  Strength, flexibility, and endurance.  Cardiovascular fitness. PROGNOSIS  If treated properly, lower back strains usually heal within 6 weeks. RELATED COMPLICATIONS   Recurring symptoms, resulting in a chronic problem.  Chronic inflammation, scarring, and partial muscle-tendon tear.  Delayed healing or resolution of symptoms.  Prolonged disability. TREATMENT  Treatment first involves the use of ice and medicine,  to reduce pain and inflammation. The use of strengthening and stretching exercises may help reduce pain with activity. These exercises may be performed at home or with a therapist. Severe injuries may require referral to a therapist for further evaluation and treatment, such as ultrasound. Your caregiver  may advise that you wear a back brace or corset, to help reduce pain and discomfort. Often, prolonged bed rest results in greater harm then benefit. Corticosteroid injections may be recommended. However, these should be reserved for the most serious cases. It is important to avoid using your back when lifting objects. At night, sleep on your back on a firm mattress with a pillow placed under your knees. If non-surgical treatment is unsuccessful, surgery may be needed.  MEDICATION   If pain medicine is needed, nonsteroidal anti-inflammatory medicines (aspirin and ibuprofen), or other minor pain relievers (acetaminophen), are often advised.  Do not take pain medicine for 7 days before surgery.  Prescription pain relievers may be given, if your caregiver thinks they are needed. Use only as directed and only as much as you need.  Ointments applied to the skin may be helpful.  Corticosteroid injections may be given by your caregiver. These injections should be reserved for the most serious cases, because they may only be given a certain number of times. HEAT AND COLD  Cold treatment (icing) should be applied for 10 to 15 minutes every 2 to 3 hours for inflammation and pain, and immediately after activity that aggravates your symptoms. Use ice packs or an ice massage.  Heat treatment may be used before performing stretching and strengthening activities prescribed by your caregiver, physical therapist, or athletic trainer. Use a heat pack or a warm water soak. SEEK MEDICAL CARE IF:   Symptoms get worse or do not improve in 2 to 4 weeks, despite treatment.  You develop numbness, weakness, or loss of bowel or bladder function.  New, unexplained symptoms develop. (Drugs used in treatment may produce side effects.)

## 2015-01-24 NOTE — ED Provider Notes (Signed)
[facility] Emergency Department Provider Note  ____________________________________________  Time seen: Approximately 2:05 AM  I have reviewed the triage vital signs and the nursing notes.   HISTORY  Chief Complaint Fall; Back Pain; and Knee Pain    HPI Sabrina Drake is a 39 y.o. female with a history of chronic pain and prior orthopedic surgeries including 3 surgeries on her left knee who presents with pain in her left knee and lower back for about 24 hours.  She reports that she slipped getting out of the shower.  She fell backwards and landed on her back but did not strike her head and did not lose consciousness.  She has no pain in her head or the back of her neck.  She reports severe pain in her knee although she is still ambulatory and was able to drive her friend to the hospital earlier tonight.  He states that the pain is worse with movement and weightbearing.  She denies fever/chills, chest pain, shortness of breath, abdominal pain.  She goes to a pain management doctor and takes 10 mg of oxycodone several times a day.   Past Medical History  Diagnosis Date  . Asthma   . Back pain   . Migraine headache   . Fibromyalgia   . Esophageal reflux   . Arthritis     Patient Active Problem List   Diagnosis Date Noted  . Dental abscess 01/13/2015  . DVT (deep venous thrombosis), left 01/13/2015  . Insomnia, persistent 06/24/2014  . Anxiety, generalized 06/24/2014  . 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 Affective disorder (HCC) 06/24/2014  . H/O arthritis 06/24/2014  . History of asthma 06/24/2014  . Thoracolumbar back pain 06/24/2014  . H/O gastrointestinal disease 06/24/2014  . Fibrositis 06/24/2014  . Fibromyalgia 06/24/2014  . History of migraine headaches 06/24/2014  . 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 Iliotibial band syndrome 10/14/2012  . Cartilage disease 10/10/2012  . Gonalgia 07/08/2012  . Bursitis of hip 09/05/2011    Past Surgical History   Procedure Laterality Date  . Knee arthroscopy      Current Outpatient Rx  Name  Route  Sig  Dispense  Refill  . ALPRAZolam (XANAX) 0.5 MG tablet   Oral   Take by mouth as needed. 2 in am, 1 in pm tablet two times daily, as needed         . DULoxetine (CYMBALTA) 60 MG capsule   Oral   Take 1 capsule by mouth daily.         Marland Kitchen LYRICA 75 MG capsule   Oral   Take 75 mg by mouth 2 (two) times daily.      5     Dispense as written.   Marland Kitchen omeprazole (PRILOSEC) 40 MG capsule   Oral   Take 1 capsule by mouth daily.         . Oxycodone HCl 10 MG TABS   Oral   Take 1 tablet by mouth every 4 (four) hours.         . OXYCONTIN 15 MG T12A 12 hr tablet   Oral   Take 15 mg by mouth 2 (two) times daily.      0     Dispense as written.   Carlena Hurl 20 MG TABS tablet   Oral   Take 1 tablet by mouth daily.      2     Dispense as written.   . zolpidem (AMBIEN) 10 MG tablet   Oral  Take 1 tablet by mouth as needed. 1 tablet at bedtime, as needed           Allergies Frovatriptan; Shellfish allergy; Sumatriptan; Toradol; Tramadol; and Zolmitriptan  Family History  Problem Relation Age of Onset  . Hypertension Mother   . Skin cancer Mother   . Heart disease Father   . Hypertension Father   . Heart disease Maternal Uncle   . Heart disease Maternal Grandfather   . Breast cancer Maternal Grandmother   . Depression Maternal Grandmother   . Stomach cancer Paternal Grandfather     Social History Social History  Substance Use Topics  . Smoking status: Never Smoker   . Smokeless tobacco: None  . Alcohol Use: No    Review of Systems Constitutional: No fever/chills Eyes: No visual changes. ENT: No sore throat. Cardiovascular: Denies chest pain. Respiratory: Denies shortness of breath. Gastrointestinal: No abdominal pain.  No nausea, no vomiting.  No diarrhea.  No constipation. Genitourinary: Negative for dysuria. Musculoskeletal: Pain in her lower back in the  middle as well as pain in her left knee Skin: Negative for rash. Neurological: Negative for headaches, focal weakness or numbness.  10-point ROS otherwise negative.  ____________________________________________   PHYSICAL EXAM:  VITAL SIGNS: ED Triage Vitals  Enc Vitals Group     BP 01/24/15 0126 155/92 mmHg     Pulse Rate 01/24/15 0126 89     Resp 01/24/15 0126 20     Temp --      Temp src --      SpO2 01/24/15 0126 100 %     Weight 01/24/15 0126 195 lb (88.451 kg)     Height 01/24/15 0126  (1.575 m)     Head Cir --      Peak Flow --      Pain Score 01/24/15 0127 8     Pain Loc --      Pain Edu? --      Excl. in GC? --     Constitutional: Alert and oriented. Well appearing and in no acute distress.  Appears uncomfortable. Eyes: Conjunctivae are normal. PERRL. EOMI. Head: Atraumatic. Nose: No congestion/rhinnorhea. Mouth/Throat: Mucous membranes are moist.  Oropharynx non-erythematous. Neck: No stridor.  No cervical spine tenderness to palpation. Cardiovascular: Normal rate, regular rhythm. Grossly normal heart sounds.  Good peripheral circulation. Respiratory: Normal respiratory effort.  No retractions. Lungs CTAB. Gastrointestinal: Soft and nontender. No distention. No abdominal bruits. No CVA tenderness. Musculoskeletal: Ill-appearing surgical scars in the left knee.  The patient complains of discomfort, she flexes and extends without any apparent tenderness.  There are no joint effusions.  There is no sign of cellulitis.  She is bearing weight.  On her back exam she has generalized tenderness to the soft tissue of the lower back but no specific bony tenderness and no step-offs or deformities on palpation. Neurologic:  Normal speech and language. No gross focal neurologic deficits are appreciated.  Skin:  Skin is warm, dry and intact. No rash noted. Psychiatric: Mood and affect are normal. Speech and behavior are  normal.  ____________________________________________   LABS (all labs ordered are listed, but only abnormal results are displayed)  Labs Reviewed - No data to display ____________________________________________  EKG  Not indicated ____________________________________________  RADIOLOGY   Dg Lumbar Spine Complete  01/24/2015  CLINICAL DATA:  Low back pain 2 days after slipping and falling in shower EXAM: LUMBAR SPINE - COMPLETE 4+ VIEW COMPARISON:  None. FINDINGS: There is no evidence  of lumbar spine fracture. Alignment is normal. Intervertebral disc spaces are maintained. IMPRESSION: Negative. Electronically Signed   By: Ellery Plunkaniel R Mitchell M.D.   On: 01/24/2015 02:34    ____________________________________________   PROCEDURES  Procedure(s) performed: None  Critical Care performed: No ____________________________________________   INITIAL IMPRESSION / ASSESSMENT AND PLAN / ED COURSE  Pertinent labs & imaging results that were available during my care of the patient were reviewed by me and considered in my medical decision making (see chart for details).  Low suspicion for bony injury but I will obtain lumbar x-rays.  I do not at all believe based on my exam that she has any acute bony issues and her left knee.  In fact she is able to flex and extend it without any discomfort which is very reassuring.  The patient states that she has narcotics at home that she [facility].  I will give her a dose equivalent which she takes at home but will not be prescribing him medications to her.  He understands this plan.  (Note that documentation was delayed due to multiple ED patients requiring immediate care.)   The patient informed her nurse that she was ready to go after the x-rays came back normal.  I discharged her with my usual customary returns/follow-up precautions and recommendations  ____________________________________________  FINAL CLINICAL IMPRESSION(S)  / ED DIAGNOSES  Final diagnoses:  Low back strain, initial encounter  Knee strain, left, initial encounter      NEW MEDICATIONS STARTED DURING THIS VISIT:  Discharge Medication List as of 01/24/2015  3:07 AM       Loleta Roseory Barney Gertsch, MD 01/24/15 [phone]

## 2015-02-09 ENCOUNTER — Other Ambulatory Visit: Payer: Self-pay | Admitting: Family Medicine

## 2015-02-09 ENCOUNTER — Telehealth: Payer: Self-pay | Admitting: Family Medicine

## 2015-02-09 MED ORDER — BENZONATATE 200 MG PO CAPS: 200.0000 mg | ORAL_CAPSULE | Freq: Three times a day (TID) | ORAL | Status: DC | PRN

## 2015-02-09 NOTE — Telephone Encounter (Signed)
Sent in SpringboroBenzonatate.

## 2015-02-09 NOTE — Telephone Encounter (Signed)
Pt states she is having a really hard time sleeping due to a cough. She is asking for cough syrup to [facility] [place]. Daughter is very sick right now and its hard for her to come in.

## 2015-02-09 NOTE — Telephone Encounter (Signed)
Pt.notified

## 2015-02-10 ENCOUNTER — Ambulatory Visit: Payer: Medicaid Other

## 2015-02-10 ENCOUNTER — Other Ambulatory Visit: Payer: Medicaid Other

## 2015-02-18 ENCOUNTER — Ambulatory Visit
Admission: RE | Admit: 2015-02-18 | Discharge: 2015-02-18 | Disposition: A | Discharge status: Home / Self Care | Payer: Medicaid Other | Source: Ambulatory Visit | Attending: Family Medicine | Admitting: Family Medicine

## 2015-02-18 ENCOUNTER — Telehealth: Payer: Self-pay | Admitting: Family Medicine

## 2015-02-18 DIAGNOSIS — M4806 Spinal stenosis, lumbar region: Secondary | ICD-10-CM | POA: Diagnosis not present

## 2015-02-18 DIAGNOSIS — M5124 Other intervertebral disc displacement, thoracic region: Secondary | ICD-10-CM | POA: Diagnosis not present

## 2015-02-18 DIAGNOSIS — M545 Low back pain, unspecified: Secondary | ICD-10-CM

## 2015-02-18 DIAGNOSIS — M5136 Other intervertebral disc degeneration, lumbar region: Secondary | ICD-10-CM | POA: Diagnosis not present

## 2015-02-18 DIAGNOSIS — M546 Pain in thoracic spine: Secondary | ICD-10-CM | POA: Insufficient documentation

## 2015-02-18 DIAGNOSIS — M5134 Other intervertebral disc degeneration, thoracic region: Secondary | ICD-10-CM | POA: Diagnosis not present

## 2015-02-18 NOTE — Telephone Encounter (Signed)
Patient was informed and asked if we could print her out a copy to take to the pain clinic when she goes next week. She agreed to setting up the Neurosurgical Consultation and stated thanks for letting her know about the results.  A copy was printed out and placed upfront for pick-up.

## 2015-02-18 NOTE — Telephone Encounter (Signed)
Please speak with Sabrina Drake and ask her if she would like a referral for Neurosurgical consultation for the findings on her recent Thoracic and Lumbar MRI results showing:  IMPRESSION: 1. No acute osseous findings or significant malalignment within the thoracolumbar spine. 2. Right paracentral disc protrusion at T7-8 minimally deforms the cord, but does not cause any nerve root encroachment. 3. Mild additional disc degeneration in the lower thoracic spine without nerve root encroachment. 4. Disc degeneration at L1-2 and L4-5. At L4-5, there is mild narrowing of the foramina bilaterally.

## 2015-02-19 ENCOUNTER — Telehealth: Payer: Self-pay

## 2015-02-19 ENCOUNTER — Other Ambulatory Visit: Payer: Self-pay | Admitting: Family Medicine

## 2015-02-19 DIAGNOSIS — M545 Low back pain: Secondary | ICD-10-CM

## 2015-02-19 DIAGNOSIS — M546 Pain in thoracic spine: Principal | ICD-10-CM

## 2015-02-19 NOTE — Telephone Encounter (Signed)
Patient wanted clarity on her MRI results. Patient was informed that the sx she was currently experiencing was due to what was found on her results and that the specialist will give her options on her to address this issue.

## 2015-02-19 NOTE — Telephone Encounter (Signed)
Referral placed.

## 2015-03-02 ENCOUNTER — Telehealth: Payer: Self-pay | Admitting: Family Medicine

## 2015-03-02 NOTE — Telephone Encounter (Signed)
Requesting return call ASAP. She has completed her MRI and is waiting on her referral to the neurologist pertaining to her hand. At this moment she has a concern that her hand has been numb all morning. States that it feel like her hands are swollen but they aren't. Should she be concerned?

## 2015-03-02 NOTE — Telephone Encounter (Signed)
Referral placed 02/19/15. Referral faxed to [facility] [phone], they'll contact pt and schedule the appt directly and notify our office. Perhaps she can contact [facility] herself and see if she can be seen sooner than later as her symptoms are progressing.

## 2015-03-02 NOTE — Telephone Encounter (Signed)
Patient informed to contact [facility]. Patient understood and will call them

## 2015-03-08 ENCOUNTER — Other Ambulatory Visit: Payer: Self-pay

## 2015-03-08 NOTE — Telephone Encounter (Signed)
Refill request was sent to Dr. Edwena FeltyAshany Sundaram for approval and submission.  Gabapentin 600MG , 2 (two) Tablet every eight hours, as needed, #180, 30 days starting 05/05/2014, Ref. x2. Inactive. Associated Diagnosis:Chronic pain disorder (338.4  G89.4) Recorded 06/15/2014 04:47 PM by Edwena FeltyASHANY SUNDARAM, MD, Annotation/Addendum.

## 2015-03-09 MED ORDER — GABAPENTIN 600 MG PO TABS: ORAL_TABLET | ORAL | Status: AC

## 2015-03-16 ENCOUNTER — Encounter: Payer: Self-pay | Admitting: Emergency Medicine

## 2015-03-16 DIAGNOSIS — R200 Anesthesia of skin: Secondary | ICD-10-CM | POA: Diagnosis not present

## 2015-03-16 DIAGNOSIS — S299XXA Unspecified injury of thorax, initial encounter: Secondary | ICD-10-CM | POA: Diagnosis not present

## 2015-03-16 DIAGNOSIS — S8992XA Unspecified injury of left lower leg, initial encounter: Secondary | ICD-10-CM | POA: Insufficient documentation

## 2015-03-16 DIAGNOSIS — Y9339 Activity, other involving climbing, rappelling and jumping off: Secondary | ICD-10-CM | POA: Insufficient documentation

## 2015-03-16 DIAGNOSIS — Y9289 Other specified places as the place of occurrence of the external cause: Secondary | ICD-10-CM | POA: Insufficient documentation

## 2015-03-16 DIAGNOSIS — G8929 Other chronic pain: Secondary | ICD-10-CM | POA: Insufficient documentation

## 2015-03-16 DIAGNOSIS — S199XXA Unspecified injury of neck, initial encounter: Secondary | ICD-10-CM | POA: Insufficient documentation

## 2015-03-16 DIAGNOSIS — Y998 Other external cause status: Secondary | ICD-10-CM | POA: Insufficient documentation

## 2015-03-16 DIAGNOSIS — X58XXXA Exposure to other specified factors, initial encounter: Secondary | ICD-10-CM | POA: Diagnosis not present

## 2015-03-16 NOTE — ED Notes (Signed)
Pt arrived to the ED for complaints of neck, back and left knee pain starting today after jumping from the top of her trucks bumper. Pt states that she has chronic back and knee pain and she exacerbated it today. Pt reports numbness of both legs. Pt is AOx4 in no apparent distress.

## 2015-03-17 ENCOUNTER — Emergency Department
Admission: EM | Admit: 2015-03-17 | Discharge: 2015-03-17 | Discharge status: Against Medical Advice | Payer: Medicaid Other | Attending: Emergency Medicine | Admitting: Emergency Medicine

## 2015-04-16 HISTORY — PX: CARPAL TUNNEL RELEASE: SHX101

## 2015-06-07 ENCOUNTER — Encounter: Payer: Self-pay | Admitting: Family Medicine

## 2015-06-07 ENCOUNTER — Ambulatory Visit (INDEPENDENT_AMBULATORY_CARE_PROVIDER_SITE_OTHER): Payer: Medicaid Other | Admitting: Family Medicine

## 2015-06-07 VITALS — BP 138/76 | HR 90 | Temp 98.5°F | Resp 18 | Ht 62.0 in | Wt 193.0 lb

## 2015-06-07 DIAGNOSIS — J209 Acute bronchitis, unspecified: Secondary | ICD-10-CM

## 2015-06-07 MED ORDER — AZITHROMYCIN 250 MG PO TABS: ORAL_TABLET | ORAL | Status: DC

## 2015-06-07 MED ORDER — GUAIFENESIN-CODEINE 100-10 MG/5ML PO SYRP: 10.0000 mL | ORAL_SOLUTION | Freq: Four times a day (QID) | ORAL | Status: DC | PRN

## 2015-06-07 NOTE — Progress Notes (Signed)
Name: Sabrina Drake   MRN: [id number]    DOB: April 14, 1975   Date:06/07/2015       Progress Note  Subjective  Chief Complaint  Chief Complaint  Patient presents with  . Acute Visit    cough / possible fever    HPI  Cough: Onset 6 days ago, mainly dry cough, some mucus, body aches, fevers (off and on) Tmax 101.69F. Sore throat from coughing. Some shortness of breath while coughing, not otherwise. Has tried Delsym, Nyquil and Robitussin, but has not helped significantly.  Past Medical History  Diagnosis Date  . Asthma   . Back pain   . Migraine headache   . Fibromyalgia   . Esophageal reflux   . Arthritis     Past Surgical History  Procedure Laterality Date  . Knee arthroscopy    . Knee replacement    . Carpal tunnel release Bilateral 04/16/2015    Family History  Problem Relation Age of Onset  . Hypertension Mother   . Skin cancer Mother   . Heart disease Father   . Hypertension Father   . Heart disease Maternal Uncle   . Heart disease Maternal Grandfather   . Breast cancer Maternal Grandmother   . Depression Maternal Grandmother   . Stomach cancer Paternal Grandfather     Social History   Social History  . Marital Status: Married    Spouse Name: N/A  . Number of Children: N/A  . Years of Education: N/A   Occupational History  . Not on file.   Social History Main Topics  . Smoking status: Passive Smoke Exposure - Never Smoker  . Smokeless tobacco: Not on file  . Alcohol Use: No  . Drug Use: No  . Sexual Activity: Yes   Other Topics Concern  . Not on file   Social History Narrative     Current outpatient prescriptions:  .  gabapentin (NEURONTIN) 600 MG tablet, 2 (two) Tablet every eight hours, as needed, Disp: 180 tablet, Rfl: 5 .  LYRICA 75 MG capsule, Take 75 mg by mouth 2 (two) times daily., Disp: , Rfl: 5 .  Oxycodone HCl 10 MG TABS, Take 1 tablet by mouth every 4 (four) hours., Disp: , Rfl:   Allergies  Allergen Reactions  .  Frovatriptan Shortness Of Breath  . Sumatriptan Succinate Shortness Of Breath  . Zolmitriptan Shortness Of Breath  . Blue Dyes (Parenteral) Hives  . Shellfish Allergy Nausea And Vomiting  . Sumatriptan   . Toradol [Ketorolac Tromethamine] Swelling  . Tramadol Nausea And Vomiting     Review of Systems  Constitutional: Positive for fever. Negative for chills.  HENT: Negative for sore throat.   Respiratory: Positive for cough, sputum production, shortness of breath and wheezing.      Objective  Filed Vitals:   06/07/15 0912  BP: 138/76  Pulse: 90  Temp: 98.5 F (36.9 C)  TempSrc: Oral  Resp: 18  Height:  (1.575 m)  Weight: 193 lb (87.544 kg)  SpO2: 97%    Physical Exam  Constitutional: She is well-developed, well-nourished, and in no distress.  HENT:  Mouth/Throat: No posterior oropharyngeal erythema.  Mucus drainage at oropharynx.  Cardiovascular: Tachycardia present.   Pulmonary/Chest: Effort normal and breath sounds normal.  Crackles at the bases, scant soft expiratory wheezing,   Nursing note and vitals reviewed.       Assessment & Plan  1. Acute bronchitis, unspecified organism  - guaiFENesin-codeine (CHERATUSSIN AC) 100-10 MG/5ML syrup; Take 10  mLs by mouth 4 (four) times daily as needed for cough.  Dispense: 200 mL; Refill: 0 - azithromycin (ZITHROMAX) 250 MG tablet; 2 tabs po x day 1, then 1 tab po q day x 4 days  Dispense: 6 each; Refill: 0   Symphonie Schneiderman Asad A. Faylene KurtzShah [facility] 06/07/2015 9:23 AM

## 2015-06-08 ENCOUNTER — Ambulatory Visit: Payer: Medicaid Other | Admitting: Family Medicine

## 2015-06-11 ENCOUNTER — Telehealth: Payer: Self-pay | Admitting: Family Medicine

## 2015-06-11 NOTE — Telephone Encounter (Signed)
Pt is a Dr Jyl HeinzSundaram/Lada pt but she saw Dr Sherryll BurgerShah for her cough and she is wanting a refill on her cough syrup that he gave to her. [facility] in [place].

## 2015-06-11 NOTE — Telephone Encounter (Signed)
Returned patient call concerning refill on Cough Syrup i left her a voice message notifying her that it is to soon to refill this medication it was prescribed and filled on 06/07/2015, suggested she can try delysum or robitussin OTC cough syrup .

## 2015-06-14 ENCOUNTER — Ambulatory Visit (INDEPENDENT_AMBULATORY_CARE_PROVIDER_SITE_OTHER): Payer: Medicaid Other | Admitting: Family Medicine

## 2015-06-14 ENCOUNTER — Encounter: Payer: Self-pay | Admitting: Family Medicine

## 2015-06-14 VITALS — BP 138/81 | HR 98 | Temp 98.2°F | Resp 18 | Ht 62.0 in | Wt 188.5 lb

## 2015-06-14 DIAGNOSIS — J209 Acute bronchitis, unspecified: Secondary | ICD-10-CM

## 2015-06-14 MED ORDER — PREDNISONE 10 MG (21) PO TBPK: 10.0000 mg | ORAL_TABLET | Freq: Every day | ORAL | Status: DC

## 2015-06-14 MED ORDER — GUAIFENESIN-CODEINE 100-10 MG/5ML PO SYRP: 10.0000 mL | ORAL_SOLUTION | Freq: Four times a day (QID) | ORAL | Status: DC | PRN

## 2015-06-14 NOTE — Progress Notes (Signed)
Name: Sabrina OrleansHeather C Drake   MRN: [id number]    DOB: [birth year]   Date:06/14/2015       Progress Note  Subjective  Chief Complaint  Chief Complaint  Patient presents with  . Follow-up    Still sick and coughing    HPI  Pt. Returns for follow up. Was diagnosed with acute bronchitis last week, and was started on Z-pak and Cheratusin. No fevers but still coughing, especially at night. Has been taking Nyquil since ran out of Cheratussin.  Past Medical History  Diagnosis Date  . Asthma   . Back pain   . Migraine headache   . Fibromyalgia   . Esophageal reflux   . Arthritis     Past Surgical History  Procedure Laterality Date  . Knee arthroscopy    . Knee replacement    . Carpal tunnel release Bilateral 04/16/2015    Family History  Problem Relation Age of Onset  . Hypertension Mother   . Skin cancer Mother   . Heart disease Father   . Hypertension Father   . Heart disease Maternal Uncle   . Heart disease Maternal Grandfather   . Breast cancer Maternal Grandmother   . Depression Maternal Grandmother   . Stomach cancer Paternal Grandfather     Social History   Social History  . Marital Status: Married    Spouse Name: N/A  . Number of Children: N/A  . Years of Education: N/A   Occupational History  . Not on file.   Social History Main Topics  . Smoking status: Passive Smoke Exposure - Never Smoker  . Smokeless tobacco: Not on file  . Alcohol Use: No  . Drug Use: No  . Sexual Activity: Yes   Other Topics Concern  . Not on file   Social History Narrative     Current outpatient prescriptions:  .  gabapentin (NEURONTIN) 600 MG tablet, 2 (two) Tablet every eight hours, as needed, Disp: 180 tablet, Rfl: 5 .  LYRICA 75 MG capsule, Take 75 mg by mouth 2 (two) times daily., Disp: , Rfl: 5 .  Oxycodone HCl 10 MG TABS, Take 1 tablet by mouth every 4 (four) hours., Disp: , Rfl:   Allergies  Allergen Reactions  . Frovatriptan Shortness Of Breath  . Sumatriptan  Succinate Shortness Of Breath  . Zolmitriptan Shortness Of Breath  . Blue Dyes (Parenteral) Hives  . Shellfish Allergy Nausea And Vomiting  . Sumatriptan   . Toradol [Ketorolac Tromethamine] Swelling  . Tramadol Nausea And Vomiting     Review of Systems  Constitutional: Negative for fever and chills.  Respiratory: Positive for cough. Negative for shortness of breath.      Objective  Filed Vitals:   06/14/15 1341  BP: 138/81  Pulse: 98  Temp: 98.2 F (36.8 C)  TempSrc: Oral  Resp: 18  Height: 5\' 2"  (1.575 m)  Weight: 188 lb 8 oz (85.503 kg)  SpO2: 99%    Physical Exam  Constitutional: She is oriented to person, place, and time and well-developed, well-nourished, and in no distress.  Cardiovascular: Regular rhythm.  Tachycardia present.   Pulmonary/Chest:  Soft expiratory wheezing on auscultation, mainly in the left lung field  Neurological: She is alert and oriented to person, place, and time.  Nursing note and vitals reviewed.    Assessment & Plan  1. Acute bronchitis, unspecified organism We will renew prescription for Cheratussin, add steroid therapy. If symptoms do not improve, she may need a chest x-ray. - predniSONE (  STERAPRED UNI-PAK 21 TAB) 10 MG (21) TBPK tablet; Take 1 tablet (10 mg total) by mouth daily. 60 50 40 then STOP  Dispense: 21 tablet; Refill: 0 - guaiFENesin-codeine (CHERATUSSIN AC) 100-10 MG/5ML syrup; Take 10 mLs by mouth 4 (four) times daily as needed for cough.  Dispense: 200 mL; Refill: 0   Loys Shugars Asad A. Faylene Kurtz [facility] 06/14/2015 1:46 PM

## 2015-06-16 ENCOUNTER — Telehealth: Payer: Self-pay | Admitting: Family Medicine

## 2015-06-16 DIAGNOSIS — J209 Acute bronchitis, unspecified: Secondary | ICD-10-CM

## 2015-06-16 MED ORDER — HYDROCOD POLST-CPM POLST ER 10-8 MG/5ML PO SUER: 5.0000 mL | Freq: Two times a day (BID) | ORAL | Status: DC | PRN

## 2015-06-16 NOTE — Telephone Encounter (Signed)
Routed to Dr. Shah for new prescription request 

## 2015-06-16 NOTE — Telephone Encounter (Signed)
Patient requesting that she please try a stronger cough medication she was not able to sleep last night. She realize that the allergy list popped up but stated that she has had it before and it worked.

## 2015-06-16 NOTE — Telephone Encounter (Signed)
Patient reports Cheratussin and prednisone are not effective against her cough, she was not able to sleep last night because of persistent coughing. We'll start on Tussionex, which she has taken in the past which worked and had no side effects and allergic reactions. Prescription will be printed and ready for pickup tomorrow morning.

## 2015-06-22 ENCOUNTER — Ambulatory Visit: Payer: Medicaid Other | Admitting: Family Medicine

## 2015-06-23 ENCOUNTER — Other Ambulatory Visit: Payer: Self-pay | Admitting: Neurosurgery

## 2015-06-23 DIAGNOSIS — M5412 Radiculopathy, cervical region: Secondary | ICD-10-CM

## 2015-07-08 ENCOUNTER — Ambulatory Visit
Admission: RE | Admit: 2015-07-08 | Discharge: 2015-07-08 | Disposition: A | Discharge status: Home / Self Care | Payer: Medicaid Other | Source: Ambulatory Visit | Attending: Neurosurgery | Admitting: Neurosurgery

## 2015-07-08 DIAGNOSIS — M50221 Other cervical disc displacement at C4-C5 level: Secondary | ICD-10-CM | POA: Diagnosis not present

## 2015-07-08 DIAGNOSIS — M50322 Other cervical disc degeneration at C5-C6 level: Secondary | ICD-10-CM | POA: Diagnosis not present

## 2015-07-08 DIAGNOSIS — M5412 Radiculopathy, cervical region: Secondary | ICD-10-CM | POA: Insufficient documentation

## 2015-07-08 DIAGNOSIS — M50222 Other cervical disc displacement at C5-C6 level: Secondary | ICD-10-CM | POA: Diagnosis not present

## 2015-07-09 ENCOUNTER — Other Ambulatory Visit: Payer: Self-pay | Admitting: Neurosurgery

## 2015-07-13 NOTE — Pre-Procedure Instructions (Signed)
Sabrina OrleansHeather C Pineo  07/13/2015      [id number] [place] [place] - [address] [place] [id number] Phone: [phone] Fax: [phone]    Your procedure is scheduled on April 28  [facility]  Call this number if you have problems the morning of surgery:  [phone]   Remember:  Do not eat food or drink liquids after midnight.  Take these medicines the morning of surgery with A SIP OF WATER albuterol (Proventil) inhaler if needed- bring with you on the day of surgery, gabapentin (Neurontin) if needed, Lyrica, Oxycodone   Stop taking Aspirin, BC's, Goody's, Herbal medications, Fish Oil, Ibuprofen, Advil, Motrin   Do not wear jewelry, make-up or nail polish.  Do not wear lotions, powders, or perfumes.  You may wear deodorant.  Do not shave 48 hours prior to surgery.  Men may shave face and neck.  Do not bring valuables to the hospital.  [facility] is not responsible for any belongings or valuables.  Contacts, dentures or bridgework may not be worn into surgery.  Leave your suitcase in the car.  After surgery it may be brought to your room.  For patients admitted to the hospital, discharge time will be determined by your treatment team.  Patients discharged the day of surgery will not be allowed to drive home.    Special instructions:  [place] - Preparing for Surgery  Before surgery, you can play an important role.  Because skin is not sterile, your skin needs to be as free of germs as possible.  You can reduce the number of germs on you skin by washing with CHG (chlorahexidine gluconate) soap before surgery.  CHG is an antiseptic cleaner which kills germs and bonds with the skin to continue killing germs even after washing.  Please DO NOT use if you have an allergy to CHG or antibacterial soaps.  If your skin becomes reddened/irritated stop using the CHG and inform your nurse when you arrive at Short  Stay.  Do not shave (including legs and underarms) for at least 48 hours prior to the first CHG shower.  You may shave your face.  Please follow these instructions carefully:   1.  Shower with CHG Soap the night before surgery and the  morning of Surgery.  2.  If you choose to wash your hair, wash your hair first as usual with your normal shampoo.  3.  After you shampoo, rinse your hair and body thoroughly to remove the Shampoo.  4.  Use CHG as you would any other liquid soap.  You can apply chg directly  to the skin and wash gently with scrungie or a clean washcloth.  5.  Apply the CHG Soap to your body ONLY FROM THE NECK DOWN.   Do not use on open wounds or open sores.  Avoid contact with your eyes, ears, mouth and genitals (private parts).  Wash genitals (private parts)  with your normal soap.  6.  Wash thoroughly, paying special attention to the area where your surgery will be performed.  7.  Thoroughly rinse your body with warm water from the neck down.  8.  DO NOT shower/wash with your normal soap after using and rinsing off  the CHG Soap.  9.  Pat yourself dry with a clean towel.            10.  Wear clean pajamas.  11.  Place clean sheets on your bed the night of your first shower and do not  sleep with pets.  Day of Surgery  Do not apply any lotions/deoderants the morning of surgery.  Please wear clean clothes to the hospital/surgery center.     Please read over the following fact sheets that you were given. Pain Booklet, Coughing and Deep Breathing, MRSA Information and Surgical Site Infection Prevention

## 2015-07-14 ENCOUNTER — Encounter (HOSPITAL_COMMUNITY)
Admission: RE | Admit: 2015-07-14 | Discharge: 2015-07-14 | Disposition: A | Discharge status: Home / Self Care | Payer: Medicaid Other | Source: Ambulatory Visit | Attending: Neurosurgery | Admitting: Neurosurgery

## 2015-07-14 ENCOUNTER — Encounter (HOSPITAL_COMMUNITY): Payer: Self-pay

## 2015-07-14 DIAGNOSIS — M50222 Other cervical disc displacement at C5-C6 level: Secondary | ICD-10-CM | POA: Insufficient documentation

## 2015-07-14 DIAGNOSIS — Z01812 Encounter for preprocedural laboratory examination: Secondary | ICD-10-CM | POA: Diagnosis present

## 2015-07-14 LAB — BASIC METABOLIC PANEL
ANION GAP: 10 (ref 5–15)
BUN: 9 mg/dL (ref 6–20)
CALCIUM: 9.5 mg/dL (ref 8.9–10.3)
CO2: 23 mmol/L (ref 22–32)
Chloride: 107 mmol/L (ref 101–111)
Creatinine, Ser: 0.81 mg/dL (ref 0.44–1.00)
GFR calc Af Amer: >60 mL/min (ref >60)
Glucose, Bld: 91 mg/dL (ref 65–99)
POTASSIUM: 4.5 mmol/L (ref 3.5–5.1)
SODIUM: 140 mmol/L (ref 135–145)

## 2015-07-14 LAB — CBC WITH DIFFERENTIAL/PLATELET
Basophils Absolute: 0 10*3/uL (ref 0.0–0.1)
Basophils Relative: 0 %
EOS PCT: 2 %
Eosinophils Absolute: 0.1 10*3/uL (ref 0.0–0.7)
HCT: 39.8 % (ref 36.0–46.0)
HEMOGLOBIN: 14.2 g/dL (ref 12.0–15.0)
LYMPHS ABS: 2 10*3/uL (ref 0.7–4.0)
LYMPHS PCT: 38 %
MCH: 31.8 pg (ref 26.0–34.0)
MCHC: 35.7 g/dL (ref 30.0–36.0)
MCV: 89 fL (ref 78.0–100.0)
Monocytes Absolute: 0.3 10*3/uL (ref 0.1–1.0)
Monocytes Relative: 6 %
Neutro Abs: 2.8 10*3/uL (ref 1.7–7.7)
Neutrophils Relative %: 54 %
PLATELETS: 209 10*3/uL (ref 150–400)
RBC: 4.47 MIL/uL (ref 3.87–5.11)
RDW: 12.3 % (ref 11.5–15.5)
WBC: 5.2 10*3/uL (ref 4.0–10.5)

## 2015-07-14 LAB — SURGICAL PCR SCREEN
MRSA, PCR: NEGATIVE
Staphylococcus aureus: NEGATIVE

## 2015-07-14 LAB — HCG, SERUM, QUALITATIVE: Preg, Serum: NEGATIVE

## 2015-07-14 NOTE — Progress Notes (Signed)
Pt denies any cardiac history, cardiologist, or heart studies EKG in EP[facility] from 01/13/15  No current cough, cold or congestion.  PCP [facility] [place]

## 2015-07-15 MED ORDER — CEFAZOLIN SODIUM-DEXTROSE 2-4 GM/100ML-% IV SOLN
2.0000 g | INTRAVENOUS | Status: AC
  Administered 2015-07-16: 2 g via INTRAVENOUS
  Filled 2015-07-15: qty 100

## 2015-07-15 MED ORDER — DEXAMETHASONE SODIUM PHOSPHATE 10 MG/ML IJ SOLN
10.0000 mg | INTRAMUSCULAR | Status: AC
  Administered 2015-07-16: 10 mg via INTRAVENOUS
  Filled 2015-07-15: qty 1

## 2015-07-16 ENCOUNTER — Ambulatory Visit (HOSPITAL_COMMUNITY): Payer: Medicaid Other | Admitting: Anesthesiology

## 2015-07-16 ENCOUNTER — Encounter (HOSPITAL_COMMUNITY): Payer: Self-pay | Admitting: *Deleted

## 2015-07-16 ENCOUNTER — Observation Stay (HOSPITAL_COMMUNITY)
Admission: RE | Admit: 2015-07-16 | Discharge: 2015-07-16 | Disposition: A | Discharge status: Home / Self Care | Payer: Medicaid Other | Source: Ambulatory Visit | Attending: Neurosurgery | Admitting: Neurosurgery

## 2015-07-16 ENCOUNTER — Encounter (HOSPITAL_COMMUNITY)
Admission: RE | Disposition: A | Discharge status: Home / Self Care | Payer: Self-pay | Source: Ambulatory Visit | Attending: Neurosurgery

## 2015-07-16 ENCOUNTER — Ambulatory Visit (HOSPITAL_COMMUNITY): Payer: Medicaid Other

## 2015-07-16 DIAGNOSIS — Z96652 Presence of left artificial knee joint: Secondary | ICD-10-CM | POA: Insufficient documentation

## 2015-07-16 DIAGNOSIS — M5020 Other cervical disc displacement, unspecified cervical region: Secondary | ICD-10-CM | POA: Diagnosis present

## 2015-07-16 DIAGNOSIS — M797 Fibromyalgia: Secondary | ICD-10-CM | POA: Insufficient documentation

## 2015-07-16 DIAGNOSIS — M50022 Cervical disc disorder at C5-C6 level with myelopathy: Principal | ICD-10-CM | POA: Insufficient documentation

## 2015-07-16 DIAGNOSIS — M50222 Other cervical disc displacement at C5-C6 level: Secondary | ICD-10-CM | POA: Diagnosis present

## 2015-07-16 DIAGNOSIS — Z419 Encounter for procedure for purposes other than remedying health state, unspecified: Secondary | ICD-10-CM

## 2015-07-16 DIAGNOSIS — F17200 Nicotine dependence, unspecified, uncomplicated: Secondary | ICD-10-CM | POA: Diagnosis not present

## 2015-07-16 HISTORY — PX: ANTERIOR CERVICAL DECOMP/DISCECTOMY FUSION: SHX1161

## 2015-07-16 SURGERY — ANTERIOR CERVICAL DECOMPRESSION/DISCECTOMY FUSION 1 LEVEL
Anesthesia: General | Site: Spine Cervical

## 2015-07-16 MED ORDER — ARTIFICIAL TEARS OP OINT
TOPICAL_OINTMENT | OPHTHALMIC | Status: DC | PRN
  Administered 2015-07-16: 1 via OPHTHALMIC

## 2015-07-16 MED ORDER — FENTANYL CITRATE (PF) 100 MCG/2ML IJ SOLN
25.0000 ug | INTRAMUSCULAR | Status: DC | PRN
  Administered 2015-07-16: 50 ug via INTRAVENOUS
  Administered 2015-07-16 (×2): 25 ug via INTRAVENOUS

## 2015-07-16 MED ORDER — 0.9 % SODIUM CHLORIDE (POUR BTL) OPTIME
TOPICAL | Status: DC | PRN
  Administered 2015-07-16: 1000 mL

## 2015-07-16 MED ORDER — HYDROMORPHONE HCL 1 MG/ML IJ SOLN: 0.5000 mg | INTRAMUSCULAR | Status: DC | PRN

## 2015-07-16 MED ORDER — GABAPENTIN 600 MG PO TABS: 1200.0000 mg | ORAL_TABLET | Freq: Three times a day (TID) | ORAL | Status: DC | PRN

## 2015-07-16 MED ORDER — EPHEDRINE 5 MG/ML INJ
INTRAVENOUS | Status: AC
  Filled 2015-07-16: qty 10

## 2015-07-16 MED ORDER — PHENOL 1.4 % MT LIQD: 1.0000 | OROMUCOSAL | Status: DC | PRN

## 2015-07-16 MED ORDER — ROCURONIUM BROMIDE 100 MG/10ML IV SOLN
INTRAVENOUS | Status: DC | PRN
  Administered 2015-07-16: 50 mg via INTRAVENOUS

## 2015-07-16 MED ORDER — THROMBIN 5000 UNITS EX SOLR
CUTANEOUS | Status: DC | PRN
  Administered 2015-07-16 (×2): 5000 [IU] via TOPICAL

## 2015-07-16 MED ORDER — ONDANSETRON HCL 4 MG/2ML IJ SOLN: 4.0000 mg | Freq: Once | INTRAMUSCULAR | Status: DC | PRN

## 2015-07-16 MED ORDER — DEXMEDETOMIDINE HCL 200 MCG/2ML IV SOLN
INTRAVENOUS | Status: DC | PRN
  Administered 2015-07-16: 8 ug via INTRAVENOUS
  Administered 2015-07-16 (×2): 4 ug via INTRAVENOUS

## 2015-07-16 MED ORDER — DEXMEDETOMIDINE HCL IN NACL 200 MCG/50ML IV SOLN
INTRAVENOUS | Status: AC
  Filled 2015-07-16: qty 50

## 2015-07-16 MED ORDER — PROPOFOL 10 MG/ML IV BOLUS
INTRAVENOUS | Status: AC
  Filled 2015-07-16: qty 20

## 2015-07-16 MED ORDER — CYCLOBENZAPRINE HCL 10 MG PO TABS
10.0000 mg | ORAL_TABLET | Freq: Three times a day (TID) | ORAL | Status: DC | PRN
  Administered 2015-07-16: 10 mg via ORAL

## 2015-07-16 MED ORDER — MIDAZOLAM HCL 2 MG/2ML IJ SOLN
INTRAMUSCULAR | Status: AC
  Filled 2015-07-16: qty 2

## 2015-07-16 MED ORDER — ALBUTEROL SULFATE (2.5 MG/3ML) 0.083% IN NEBU: 3.0000 mL | INHALATION_SOLUTION | Freq: Four times a day (QID) | RESPIRATORY_TRACT | Status: DC | PRN

## 2015-07-16 MED ORDER — ACETAMINOPHEN 650 MG RE SUPP: 650.0000 mg | RECTAL | Status: DC | PRN

## 2015-07-16 MED ORDER — ONDANSETRON HCL 4 MG/2ML IJ SOLN
INTRAMUSCULAR | Status: DC | PRN
  Administered 2015-07-16: 4 mg via INTRAVENOUS

## 2015-07-16 MED ORDER — SUGAMMADEX SODIUM 200 MG/2ML IV SOLN
INTRAVENOUS | Status: AC
  Filled 2015-07-16: qty 2

## 2015-07-16 MED ORDER — OXYCODONE HCL 5 MG PO TABS: 10.0000 mg | ORAL_TABLET | ORAL | Status: DC | PRN

## 2015-07-16 MED ORDER — DEXAMETHASONE SODIUM PHOSPHATE 10 MG/ML IJ SOLN
INTRAMUSCULAR | Status: AC
  Filled 2015-07-16: qty 1

## 2015-07-16 MED ORDER — MIDAZOLAM HCL 5 MG/5ML IJ SOLN
INTRAMUSCULAR | Status: DC | PRN
Start: 2015-07-16 — End: 2015-07-16
  Administered 2015-07-16: 2 mg via INTRAVENOUS

## 2015-07-16 MED ORDER — CYCLOBENZAPRINE HCL 10 MG PO TABS
ORAL_TABLET | ORAL | Status: AC
  Filled 2015-07-16: qty 1

## 2015-07-16 MED ORDER — SODIUM CHLORIDE 0.9% FLUSH: 3.0000 mL | INTRAVENOUS | Status: DC | PRN

## 2015-07-16 MED ORDER — PROPOFOL 10 MG/ML IV BOLUS
INTRAVENOUS | Status: DC | PRN
Start: 2015-07-16 — End: 2015-07-16
  Administered 2015-07-16: 175 mg via INTRAVENOUS

## 2015-07-16 MED ORDER — OXYCODONE-ACETAMINOPHEN 5-325 MG PO TABS
1.0000 | ORAL_TABLET | ORAL | Status: DC | PRN
  Administered 2015-07-16: 2 via ORAL
  Filled 2015-07-16: qty 2

## 2015-07-16 MED ORDER — CEFAZOLIN SODIUM 1-5 GM-% IV SOLN
1.0000 g | Freq: Three times a day (TID) | INTRAVENOUS | Status: DC
Start: 2015-07-16 — End: 2015-07-16

## 2015-07-16 MED ORDER — LIDOCAINE HCL (CARDIAC) 20 MG/ML IV SOLN
INTRAVENOUS | Status: DC | PRN
  Administered 2015-07-16: 30 mg via INTRAVENOUS

## 2015-07-16 MED ORDER — LACTATED RINGERS IV SOLN
INTRAVENOUS | Status: DC | PRN
  Administered 2015-07-16 (×2): via INTRAVENOUS

## 2015-07-16 MED ORDER — HEMOSTATIC AGENTS (NO CHARGE) OPTIME
TOPICAL | Status: DC | PRN
  Administered 2015-07-16: 1 via TOPICAL

## 2015-07-16 MED ORDER — SODIUM CHLORIDE 0.9 % IR SOLN
Status: DC | PRN
  Administered 2015-07-16: 12:00:00

## 2015-07-16 MED ORDER — FENTANYL CITRATE (PF) 100 MCG/2ML IJ SOLN
INTRAMUSCULAR | Status: AC
  Filled 2015-07-16: qty 2

## 2015-07-16 MED ORDER — ONDANSETRON HCL 4 MG/2ML IJ SOLN
INTRAMUSCULAR | Status: AC
  Filled 2015-07-16: qty 6

## 2015-07-16 MED ORDER — FENTANYL CITRATE (PF) 100 MCG/2ML IJ SOLN
INTRAMUSCULAR | Status: DC | PRN
  Administered 2015-07-16: 50 ug via INTRAVENOUS
  Administered 2015-07-16: 200 ug via INTRAVENOUS

## 2015-07-16 MED ORDER — PREGABALIN 75 MG PO CAPS
75.0000 mg | ORAL_CAPSULE | Freq: Two times a day (BID) | ORAL | Status: DC
Start: 2015-07-16 — End: 2015-07-16

## 2015-07-16 MED ORDER — ONDANSETRON HCL 4 MG/2ML IJ SOLN: 4.0000 mg | INTRAMUSCULAR | Status: DC | PRN

## 2015-07-16 MED ORDER — LACTATED RINGERS IV SOLN
INTRAVENOUS | Status: DC
  Administered 2015-07-16: 10:00:00 via INTRAVENOUS

## 2015-07-16 MED ORDER — SUGAMMADEX SODIUM 200 MG/2ML IV SOLN
INTRAVENOUS | Status: DC | PRN
  Administered 2015-07-16: 200 mg via INTRAVENOUS

## 2015-07-16 MED ORDER — HYDROCODONE-ACETAMINOPHEN 5-325 MG PO TABS: 1.0000 | ORAL_TABLET | ORAL | Status: DC | PRN

## 2015-07-16 MED ORDER — MENTHOL 3 MG MT LOZG: 1.0000 | LOZENGE | OROMUCOSAL | Status: DC | PRN

## 2015-07-16 MED ORDER — LIDOCAINE 2% (20 MG/ML) 5 ML SYRINGE
INTRAMUSCULAR | Status: AC
  Filled 2015-07-16: qty 15

## 2015-07-16 MED ORDER — ACETAMINOPHEN 325 MG PO TABS: 650.0000 mg | ORAL_TABLET | ORAL | Status: DC | PRN

## 2015-07-16 MED ORDER — SODIUM CHLORIDE 0.9% FLUSH: 3.0000 mL | Freq: Two times a day (BID) | INTRAVENOUS | Status: DC

## 2015-07-16 MED ORDER — ARTIFICIAL TEARS OP OINT
TOPICAL_OINTMENT | OPHTHALMIC | Status: AC
  Filled 2015-07-16: qty 7

## 2015-07-16 MED ORDER — ROCURONIUM BROMIDE 50 MG/5ML IV SOLN
INTRAVENOUS | Status: AC
  Filled 2015-07-16: qty 3

## 2015-07-16 MED ORDER — FENTANYL CITRATE (PF) 250 MCG/5ML IJ SOLN
INTRAMUSCULAR | Status: AC
  Filled 2015-07-16: qty 5

## 2015-07-16 SURGICAL SUPPLY — 54 items
BAG DECANTER FOR FLEXI CONT (MISCELLANEOUS) ×3 IMPLANT
BENZOIN TINCTURE PRP APPL 2/3 (GAUZE/BANDAGES/DRESSINGS) ×3 IMPLANT
BIT DRILL 13 (BIT) ×2 IMPLANT
BIT DRILL 13MM (BIT) ×1
BRUSH SCRUB EZ PLAIN DRY (MISCELLANEOUS) ×3 IMPLANT
BUR MATCHSTICK NEURO 3.0 LAGG (BURR) ×3 IMPLANT
CAGE PEEK 6X14X11 (Cage) ×2 IMPLANT
CAGE SPNL 11X14X6XRADOPQ (Cage) ×1 IMPLANT
CANISTER SUCT 3000ML PPV (MISCELLANEOUS) ×3 IMPLANT
CLOSURE WOUND 1/2 X4 (GAUZE/BANDAGES/DRESSINGS) ×1
DRAPE C-ARM 42X72 X-RAY (DRAPES) ×6 IMPLANT
DRAPE LAPAROTOMY 100X72 PEDS (DRAPES) ×3 IMPLANT
DRAPE MICROSCOPE LEICA (MISCELLANEOUS) ×3 IMPLANT
DRAPE POUCH INSTRU U-SHP 10X18 (DRAPES) ×3 IMPLANT
DRSG OPSITE POSTOP 4X6 (GAUZE/BANDAGES/DRESSINGS) ×3 IMPLANT
DURAPREP 6ML APPLICATOR 50/CS (WOUND CARE) ×3 IMPLANT
ELECT COATED BLADE 2.86 ST (ELECTRODE) ×3 IMPLANT
ELECT REM PT RETURN 9FT ADLT (ELECTROSURGICAL) ×3
ELECTRODE REM PT RTRN 9FT ADLT (ELECTROSURGICAL) ×1 IMPLANT
GAUZE SPONGE 4X4 12PLY STRL (GAUZE/BANDAGES/DRESSINGS) ×3 IMPLANT
GAUZE SPONGE 4X4 16PLY XRAY LF (GAUZE/BANDAGES/DRESSINGS) IMPLANT
GLOVE BIOGEL PI IND STRL 7.5 (GLOVE) ×2 IMPLANT
GLOVE BIOGEL PI INDICATOR 7.5 (GLOVE) ×4
GLOVE ECLIPSE 9.0 STRL (GLOVE) ×3 IMPLANT
GLOVE EXAM NITRILE LRG STRL (GLOVE) IMPLANT
GLOVE EXAM NITRILE MD LF STRL (GLOVE) IMPLANT
GLOVE EXAM NITRILE XL STR (GLOVE) IMPLANT
GLOVE EXAM NITRILE XS STR PU (GLOVE) IMPLANT
GOWN STRL REUS W/ TWL LRG LVL3 (GOWN DISPOSABLE) IMPLANT
GOWN STRL REUS W/ TWL XL LVL3 (GOWN DISPOSABLE) IMPLANT
GOWN STRL REUS W/TWL 2XL LVL3 (GOWN DISPOSABLE) IMPLANT
GOWN STRL REUS W/TWL LRG LVL3 (GOWN DISPOSABLE)
GOWN STRL REUS W/TWL XL LVL3 (GOWN DISPOSABLE)
HALTER HD/CHIN CERV TRACTION D (MISCELLANEOUS) ×3 IMPLANT
KIT BASIN OR (CUSTOM PROCEDURE TRAY) ×3 IMPLANT
KIT ROOM TURNOVER OR (KITS) ×3 IMPLANT
NEEDLE SPNL 20GX3.5 QUINCKE YW (NEEDLE) ×3 IMPLANT
NS IRRIG 1000ML POUR BTL (IV SOLUTION) ×3 IMPLANT
PACK LAMINECTOMY NEURO (CUSTOM PROCEDURE TRAY) ×3 IMPLANT
PAD ARMBOARD 7.5X6 YLW CONV (MISCELLANEOUS) ×9 IMPLANT
PLATE 23MM (Plate) ×3 IMPLANT
RUBBERBAND STERILE (MISCELLANEOUS) ×6 IMPLANT
SCREW ST 13X4XST VA NS SPNE (Screw) ×4 IMPLANT
SCREW ST VAR 4 ATL (Screw) ×8 IMPLANT
SPONGE INTESTINAL PEANUT (DISPOSABLE) ×3 IMPLANT
SPONGE SURGIFOAM ABS GEL SZ50 (HEMOSTASIS) ×3 IMPLANT
STRIP CLOSURE SKIN 1/2X4 (GAUZE/BANDAGES/DRESSINGS) ×2 IMPLANT
SUT VIC AB 3-0 SH 8-18 (SUTURE) ×3 IMPLANT
SUT VIC AB 4-0 RB1 18 (SUTURE) ×3 IMPLANT
TAPE CLOTH 4X10 WHT NS (GAUZE/BANDAGES/DRESSINGS) ×3 IMPLANT
TOWEL OR 17X24 6PK STRL BLUE (TOWEL DISPOSABLE) ×3 IMPLANT
TOWEL OR 17X26 10 PK STRL BLUE (TOWEL DISPOSABLE) ×3 IMPLANT
TRAP SPECIMEN MUCOUS 40CC (MISCELLANEOUS) ×3 IMPLANT
WATER STERILE IRR 1000ML POUR (IV SOLUTION) ×3 IMPLANT

## 2015-07-16 NOTE — Anesthesia Preprocedure Evaluation (Signed)
Anesthesia Evaluation  Patient identified by MRN, date of birth, ID band Patient awake    Reviewed: Allergy & Precautions, NPO status , Patient's Chart, lab work & pertinent test results  Airway Mallampati: II  TM Distance: >3 FB Neck ROM: Full    Dental  (+) Teeth Intact, Dental Advisory Given, Poor Dentition   Pulmonary    breath sounds clear to auscultation       Cardiovascular  Rhythm:Regular Rate:Normal     Neuro/Psych    GI/Hepatic   Endo/Other    Renal/GU      Musculoskeletal   Abdominal   Peds  Hematology   Anesthesia Other Findings   Reproductive/Obstetrics                             Anesthesia Physical Anesthesia Plan  ASA: II  Anesthesia Plan: General   Post-op Pain Management:    Induction: Intravenous  Airway Management Planned: Oral ETT  Additional Equipment:   Intra-op Plan:   Post-operative Plan:   Informed Consent: I have reviewed the patients History and Physical, chart, labs and discussed the procedure including the risks, benefits and alternatives for the proposed anesthesia with the patient or authorized representative who has indicated his/her understanding and acceptance.   Dental advisory given  Plan Discussed with: CRNA and Anesthesiologist  Anesthesia Plan Comments: (Fibromyalgia Smoker Mild asthma  Plan GA with oral ETT  Kipp Broodavid Karis Emig)        Anesthesia Quick Evaluation

## 2015-07-16 NOTE — H&P (Signed)
  Sabrina OrleansHeather C Drake is an 40 y.o. female.   Chief Complaint: Neck pain HPI: 40 year old female with chronic neck and bilateral upper extremity pain, paresthesias and numbness. 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 Patient has failed conservative management presents now for C5-6 anterior cervical discectomy and fusion.  Past Medical History  Diagnosis Date  . Asthma   . Back pain   . Migraine headache   . Fibromyalgia   . Esophageal reflux   . Arthritis     Past Surgical History  Procedure Laterality Date  . Knee arthroscopy Left     scoped twice  . Knee replacement Left     2016  . Carpal tunnel release Bilateral 04/16/2015    second wrist in February    Family History  Problem Relation Age of Onset  . Hypertension Mother   . Skin cancer Mother   . Heart disease Father   . Hypertension Father   . Heart disease Maternal Uncle   . Heart disease Maternal Grandfather   . Breast cancer Maternal Grandmother   . Depression Maternal Grandmother   . Stomach cancer Paternal Grandfather    Social History:  reports that she has been passively smoking.  She does not have any smokeless tobacco history on file. She reports that she does not drink alcohol or use illicit drugs.  Allergies:  Allergies  Allergen Reactions  . Frovatriptan Shortness Of Breath  . Sumatriptan Succinate Shortness Of Breath  . Zolmitriptan Shortness Of Breath  . Blue Dyes (Parenteral) Hives  . Toradol [Ketorolac Tromethamine] Swelling  . Shellfish Allergy Nausea And Vomiting  . Tramadol Nausea And Vomiting    Medications Prior to Admission  Medication Sig Dispense Refill  . albuterol (PROVENTIL HFA;VENTOLIN HFA) 108 (90 Base) MCG/ACT inhaler Inhale 1-2 puffs into the lungs every 6 (six) hours as needed for wheezing or shortness of breath.    . gabapentin (NEURONTIN) 600 MG tablet 2 (two) Tablet every eight hours, as  needed (Patient taking differently: Take 1,200 mg by mouth every 8 (eight) hours as needed (pain). ) 180 tablet 5  . LYRICA 75 MG capsule Take 75 mg by mouth 2 (two) times daily.  5  . Oxycodone HCl 10 MG TABS Take 1 tablet by mouth every 4 (four) hours.      No results found for this or any previous visit (from the past 48 hour(s)). No results found.    Blood pressure 116/80, pulse 60, temperature 98.5 F (36.9 C), temperature source Oral, resp. rate 18, height 5\' 2"  (1.575 m), weight 81.647 kg (180 lb), SpO2 100 %.  A she is awake and alert. She is oriented and appropriate. Her cranial nerve function is intact. Her speech is fluent. Judgment and insight are intact. Motor examination is intact in both upper extremities. Sensory examination with some patchy distal sensory loss in both upper extremities but no dermatomal sensory loss. Reflexes are brisk but symmetric. No evidence of long track signs. Gait and posture normal. Examination head ears eyes and thirds unremarkable. Chest and abdomen are benign. Extremities are free from injury deformity. Neck is supple. Airway is midline. Pulses are normal.   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 Plan C5-6 anterior cervical discectomy with interbody fusion utilizing interbody peek cage, locally harvested autograft, and anterior plate instrumentation. Risks and benefits of been explained. Patient wishes to proceed.  Georgianna Band A 07/16/2015, 10:54 AM

## 2015-07-16 NOTE — Transfer of Care (Signed)
Immediate Anesthesia Transfer of Care Note  Patient: Sabrina Drake  Procedure(s) Performed: Procedure(s): CERVICAL FIVE-SIX ANTERIOR CERVICAL DECOMPRESSION/DISCECTOMY FUSION  (N/A)  Patient Location: PACU  Anesthesia Type:General  Level of Consciousness: awake, alert  and oriented  Airway & Oxygen Therapy: Patient Spontanous Breathing and Patient connected to nasal cannula oxygen  Post-op Assessment: Report given to RN, Post -op Vital signs reviewed and stable and Patient moving all extremities X 4  Post vital signs: Reviewed and stable  Last Vitals:  Filed Vitals:   07/16/15 0946  BP: 116/80  Pulse: 60  Temp: 36.9 C  Resp: 18    Last Pain:  Filed Vitals:   07/16/15 0950  PainSc: 7       Patients Stated Pain Goal: 3 (07/16/15 0949)  Complications: No apparent anesthesia complications

## 2015-07-16 NOTE — Discharge Instructions (Signed)

## 2015-07-16 NOTE — Discharge Summary (Signed)
Physician Discharge Summary  Patient ID: Sabrina Drake MRN: [id number] DOB/AGE: Aug 07, 1975 40 y.o.  Admit date: 07/16/2015 Discharge daDanae Orleanste: 07/16/2015  Admission Diagnoses:  Discharge Diagnoses:  Active Problems:   Cervical herniated disc   Discharged Condition: good  Hospital Course: Patient admitted to the hospital where she underwent an uncomplicated C5-6 anterior cervical discectomy and fusion. Postoperatively she is doing well. Preoperative neck and upper extremity pain much improved. Strength and sensation intact. Ready for discharge home.  Consults:   Significant Diagnostic Studies:   Treatments:   Discharge Exam: Blood pressure 144/74, pulse 45, temperature 97 F (36.1 C), temperature source Oral, resp. rate 14, height 5\' 2"  (1.575 m), weight 81.647 kg (180 lb), SpO2 100 %. Awake and alert. Oriented and appropriate. Cranial nerve function intact. Motor and sensory function extremities normal. Wound clean and dry. Chest and abdomen benign.  Disposition: 07-Left Against Medical Advice or Left Without Being Seen     Medication List    TAKE these medications        albuterol 108 (90 Base) MCG/ACT inhaler  Commonly known as:  PROVENTIL HFA;VENTOLIN HFA  Inhale 1-2 puffs into the lungs every 6 (six) hours as needed for wheezing or shortness of breath.     gabapentin 600 MG tablet  Commonly known as:  NEURONTIN  2 (two) Tablet every eight hours, as needed     LYRICA 75 MG capsule  Generic drug:  pregabalin  Take 75 mg by mouth 2 (two) times daily.     Oxycodone HCl 10 MG Tabs  Take 1 tablet by mouth every 4 (four) hours.           Follow-up Information    Follow up with Temple PaciniPOOL,Miyu Fenderson A, MD.   Specialty:  Neurosurgery   Contact information:   [address] [place] [address] [phone]       Signed: Temple PaciniOOL,Albino Bufford A 07/16/2015, 2:09 PM

## 2015-07-16 NOTE — Progress Notes (Signed)
Patient alert and oriented, mae's well, voiding adequate amount of urine, swallowing without difficulty, no c/o pain. Patient discharged home with family. Script and discharged instructions given to patient. Patient and family stated understanding of d/c instructions given and has an appointment with MD. 

## 2015-07-16 NOTE — Anesthesia Procedure Notes (Signed)
Procedure Name: Intubation Date/Time: 07/16/2015 11:35 AM Performed by: Carmela RimaMARTINELLI, Kelani Robart F Pre-anesthesia Checklist: Patient being monitored, Suction available, Emergency Drugs available, Timeout performed and Patient identified Patient Re-evaluated:Patient Re-evaluated prior to inductionOxygen Delivery Method: Circle system utilized Preoxygenation: Pre-oxygenation with 100% oxygen Intubation Type: IV induction Ventilation: Mask ventilation without difficulty Laryngoscope Size: Mac and 3 Grade View: Grade I Tube type: Oral Tube size: 7.0 mm Number of attempts: 1 Placement Confirmation: positive ETCO2,  ETT inserted through vocal cords under direct vision and breath sounds checked- equal and bilateral Secured at: 22 cm Tube secured with: Tape Dental Injury: Teeth and Oropharynx as per pre-operative assessment

## 2015-07-16 NOTE — Brief Op Note (Signed)
07/16/2015  12:40 PM  PATIENT:  Sabrina OrleansHeather C Drake  40 y.o. female  PRE-OPERATIVE DIAGNOSIS:  HNP  POST-OPERATIVE DIAGNOSIS:  * No post-op diagnosis entered *  PROCEDURE:  Procedure(s): CERVICAL FIVE-SIX ANTERIOR CERVICAL DECOMPRESSION/DISCECTOMY FUSION  (N/A)  SURGEON:  Surgeon(s) and Role:    * Julio SicksHenry Jerone Cudmore, MD - Primary  PHYSICIAN ASSISTANT:   ASSISTANTS: Cabbell   ANESTHESIA:   general  EBL:  Total I/O In: 1000 [I.V.:1000] Out: -   BLOOD ADMINISTERED:none  DRAINS: none   LOCAL MEDICATIONS USED:  NONE  SPECIMEN:  No Specimen  DISPOSITION OF SPECIMEN:  N/A  COUNTS:  YES  TOURNIQUET:  * No tourniquets in log *  DICTATION: .Dragon Dictation  PLAN OF CARE: Admit for overnight observation  PATIENT DISPOSITION:  PACU - hemodynamically stable.   Delay start of Pharmacological VTE agent (>24hrs) due to surgical blood loss or risk of bleeding: yes

## 2015-07-16 NOTE — Anesthesia Postprocedure Evaluation (Signed)
Anesthesia Post Note  Patient: Sabrina OrleansHeather C Drake  Procedure(s) Performed: Procedure(s) (LRB): CERVICAL FIVE-SIX ANTERIOR CERVICAL DECOMPRESSION/DISCECTOMY FUSION  (N/A)  Patient location during evaluation: PACU Anesthesia Type: General Level of consciousness: awake, awake and alert and oriented Pain management: pain level controlled Vital Signs Assessment: post-procedure vital signs reviewed and stable Respiratory status: spontaneous breathing, nonlabored ventilation and respiratory function stable Cardiovascular status: blood pressure returned to baseline Anesthetic complications: no    Last Vitals:  Filed Vitals:   07/16/15 1441 07/16/15 1612  BP: 129/65 114/55  Pulse: 53 57  Temp: 36.8 C 37.2 C  Resp: 16 16    Last Pain:  Filed Vitals:   07/16/15 1612  PainSc: 6                  Casidy Alberta COKER

## 2015-07-16 NOTE — Op Note (Signed)
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  Surgeon:Nachmen Mansel A.Alba Kriesel, M.D.  Asst. Surgeon: Franky Machoabbell  Anesthesia: General  Indication: 40 year old female with chronic neck and bilateral upper extremities symptoms failing conservative management her workup demonstrates evidence of a central disc herniation with spinal cord compression at C5-6. Patient presents now for anterior cervical discectomy and fusion in hopes of improving her symptoms.  Operative note: After induction of anesthesia, patient position supine with neck slightly extended and held place of halter traction. Patient's anterior cervical region prepped and draped sterilely. Incision made overlying C5-6. Dissection proceeds down to the platysma. Platysma was then divided vertically and dissection proceeds along the medial border of the sternocleidomastoid muscle and carotid sheath. Trachea and esophagus are mobilized and retracted towards the left.  Fascia stripped off the anterior spinal column. Longus: Muscles elevated bilaterally. Self retaining retractors placed intraoperative fluoroscopy used and levels confirmed. Space at C5-6 in size. Discectomy performed with various instruments down to level of the posterior annulus. Microscope brought field used throughout the remainder of the discectomy. Remaining aspects of annulus and osteophytes removed using high-speed drill down to the level of the posterior longitudinal ligament. Posterior longitudinal limb is an elevated and resected piecemeal fashion using Kerrison rongeurs. Underlying thecal sac identified. Disc herniation identified and completely resected. Wide central  decompression performed by undercutting the bodies of C5 and C6. Decompression then proceeded into each neural foramen. Anterior foraminotomies performed on course exiting C6 nerve roots bilaterally. Gelfoam placed topically for hemostasis. 6 mm Metronic anatomic peek cage packed with harvested autograft was then packed into place and recessed slightly from the anterior cortical margin. 23 mm Atlantis anterior cervical plate was then placed over the C5 and C6 levels. This an attachment or fluoroscopic guidance using 13 mm variable-angle screws 2 each at both levels. All 4 screws given a final tightening and found to be solidly within the bone. Locking screws engaged. Final images revealed good position of the bone grafts and hardware at proper operative level with normal alignment is spine. Wounds and irrigated one final time. Hemostasis was assured. Wounds and closed in a typical fashion. There were no apparent complications. Patient tolerated the procedure well and returns to the recovery room postop.

## 2015-07-19 ENCOUNTER — Encounter (HOSPITAL_COMMUNITY): Payer: Self-pay | Admitting: Neurosurgery

## 2015-08-04 NOTE — Addendum Note (Signed)
Addendum  created 08/04/15 1007 by Ayleah Hofmeister, MD   Modules edited: Anesthesia Responsible Staff    

## 2015-08-10 ENCOUNTER — Emergency Department
Admission: EM | Admit: 2015-08-10 | Discharge: 2015-08-10 | Disposition: A | Discharge status: Home / Self Care | Payer: Medicaid Other | Attending: Emergency Medicine | Admitting: Emergency Medicine

## 2015-08-10 ENCOUNTER — Emergency Department: Payer: Medicaid Other

## 2015-08-10 DIAGNOSIS — M1990 Unspecified osteoarthritis, unspecified site: Secondary | ICD-10-CM | POA: Insufficient documentation

## 2015-08-10 DIAGNOSIS — R569 Unspecified convulsions: Secondary | ICD-10-CM | POA: Insufficient documentation

## 2015-08-10 DIAGNOSIS — Z91013 Allergy to seafood: Secondary | ICD-10-CM | POA: Diagnosis not present

## 2015-08-10 DIAGNOSIS — K859 Acute pancreatitis without necrosis or infection, unspecified: Secondary | ICD-10-CM | POA: Diagnosis not present

## 2015-08-10 DIAGNOSIS — Z79899 Other long term (current) drug therapy: Secondary | ICD-10-CM | POA: Diagnosis not present

## 2015-08-10 DIAGNOSIS — Z86718 Personal history of other venous thrombosis and embolism: Secondary | ICD-10-CM | POA: Diagnosis not present

## 2015-08-10 DIAGNOSIS — J45909 Unspecified asthma, uncomplicated: Secondary | ICD-10-CM | POA: Insufficient documentation

## 2015-08-10 DIAGNOSIS — R55 Syncope and collapse: Secondary | ICD-10-CM | POA: Diagnosis present

## 2015-08-10 DIAGNOSIS — Z7722 Contact with and (suspected) exposure to environmental tobacco smoke (acute) (chronic): Secondary | ICD-10-CM | POA: Diagnosis not present

## 2015-08-10 DIAGNOSIS — M5020 Other cervical disc displacement, unspecified cervical region: Secondary | ICD-10-CM | POA: Insufficient documentation

## 2015-08-10 LAB — CBC
HEMATOCRIT: 43.1 % (ref 35.0–47.0)
HEMOGLOBIN: 15 g/dL (ref 12.0–16.0)
MCH: 30.8 pg (ref 26.0–34.0)
MCHC: 34.8 g/dL (ref 32.0–36.0)
MCV: 88.3 fL (ref 80.0–100.0)
Platelets: 231 10*3/uL (ref 150–440)
RBC: 4.88 MIL/uL (ref 3.80–5.20)
RDW: 12.5 % (ref 11.5–14.5)
WBC: 12.1 10*3/uL — ABNORMAL HIGH (ref 3.6–11.0)

## 2015-08-10 LAB — COMPREHENSIVE METABOLIC PANEL
ALBUMIN: 4.6 g/dL (ref 3.5–5.0)
ALK PHOS: 55 U/L (ref 38–126)
ALT: 14 U/L (ref 14–54)
AST: 23 U/L (ref 15–41)
Anion gap: 10 (ref 5–15)
BILIRUBIN TOTAL: 0.6 mg/dL (ref 0.3–1.2)
BUN: 11 mg/dL (ref 6–20)
CALCIUM: 9.6 mg/dL (ref 8.9–10.3)
CO2: 24 mmol/L (ref 22–32)
CREATININE: 0.79 mg/dL (ref 0.44–1.00)
Chloride: 104 mmol/L (ref 101–111)
GFR calc Af Amer: >60 mL/min (ref >60)
GFR calc non Af Amer: >60 mL/min (ref >60)
GLUCOSE: 110 mg/dL — AB (ref 65–99)
Potassium: 3.2 mmol/L — ABNORMAL LOW (ref 3.5–5.1)
SODIUM: 138 mmol/L (ref 135–145)
TOTAL PROTEIN: 7.3 g/dL (ref 6.5–8.1)

## 2015-08-10 LAB — URINALYSIS COMPLETE WITH MICROSCOPIC (ARMC ONLY)
BACTERIA UA: NONE SEEN
Bilirubin Urine: NEGATIVE
GLUCOSE, UA: NEGATIVE mg/dL
HGB URINE DIPSTICK: NEGATIVE
Leukocytes, UA: NEGATIVE
Nitrite: NEGATIVE
Protein, ur: 100 mg/dL — AB
RBC / HPF: NONE SEEN RBC/hpf (ref 0–5)
Specific Gravity, Urine: 1.021 (ref 1.005–1.030)
pH: 9 — ABNORMAL HIGH (ref 5.0–8.0)

## 2015-08-10 LAB — URINE DRUG SCREEN, QUALITATIVE (ARMC ONLY)
AMPHETAMINES, UR SCREEN: NOT DETECTED
BARBITURATES, UR SCREEN: NOT DETECTED
Benzodiazepine, Ur Scrn: NOT DETECTED
COCAINE METABOLITE, UR ~~LOC~~: NOT DETECTED
Cannabinoid 50 Ng, Ur ~~LOC~~: NOT DETECTED
MDMA (ECSTASY) UR SCREEN: NOT DETECTED
METHADONE SCREEN, URINE: NOT DETECTED
Opiate, Ur Screen: NOT DETECTED
Phencyclidine (PCP) Ur S: NOT DETECTED
TRICYCLIC, UR SCREEN: NOT DETECTED

## 2015-08-10 LAB — TROPONIN I: Troponin I: <0.03 ng/mL (ref <0.031)

## 2015-08-10 LAB — LIPASE, BLOOD: LIPASE: 369 U/L — AB (ref 11–51)

## 2015-08-10 LAB — SALICYLATE LEVEL: Salicylate Lvl: <4 mg/dL (ref 2.8–30.0)

## 2015-08-10 LAB — ACETAMINOPHEN LEVEL

## 2015-08-10 LAB — AMMONIA: AMMONIA: 35 umol/L (ref 9–35)

## 2015-08-10 LAB — PREGNANCY, URINE: PREG TEST UR: NEGATIVE

## 2015-08-10 MED ORDER — ONDANSETRON HCL 4 MG/2ML IJ SOLN
INTRAMUSCULAR | Status: AC
  Administered 2015-08-10: 4 mg via INTRAVENOUS
  Filled 2015-08-10: qty 2

## 2015-08-10 MED ORDER — DIPHENHYDRAMINE HCL 50 MG/ML IJ SOLN
12.5000 mg | Freq: Once | INTRAMUSCULAR | Status: AC
  Administered 2015-08-10: 12.5 mg via INTRAVENOUS
  Filled 2015-08-10: qty 1

## 2015-08-10 MED ORDER — MORPHINE SULFATE (PF) 4 MG/ML IV SOLN
4.0000 mg | Freq: Once | INTRAVENOUS | Status: AC
  Administered 2015-08-10: 4 mg via INTRAVENOUS
  Filled 2015-08-10: qty 1

## 2015-08-10 MED ORDER — ONDANSETRON HCL 4 MG/2ML IJ SOLN
4.0000 mg | Freq: Once | INTRAMUSCULAR | Status: AC
  Administered 2015-08-10: 4 mg via INTRAVENOUS

## 2015-08-10 MED ORDER — METOCLOPRAMIDE HCL 5 MG/ML IJ SOLN
10.0000 mg | Freq: Once | INTRAMUSCULAR | Status: AC
  Administered 2015-08-10: 10 mg via INTRAVENOUS
  Filled 2015-08-10: qty 2

## 2015-08-10 MED ORDER — SODIUM CHLORIDE 0.9 % IV BOLUS (SEPSIS)
1000.0000 mL | Freq: Once | INTRAVENOUS | Status: AC
  Administered 2015-08-10: 1000 mL via INTRAVENOUS

## 2015-08-10 NOTE — Discharge Instructions (Signed)
Seizure, Adult A seizure is abnormal electrical activity in the brain. Seizures usually last from 30 seconds to 2 minutes. There are various types of seizures. Before a seizure, you may have a warning sensation (aura) that a seizure is about to occur. An aura may include the following symptoms:   Fear or anxiety.  Nausea.  Feeling like the room is spinning (vertigo).  Vision changes, such as seeing flashing lights or spots. Common symptoms during a seizure include:  A change in attention or behavior (altered mental status).  Convulsions with rhythmic jerking movements.  Drooling.  Rapid eye movements.  Grunting.  Loss of bladder and bowel control.  Bitter taste in the mouth.  Tongue biting. After a seizure, you may feel confused and sleepy. You may also have an injury resulting from convulsions during the seizure. HOME CARE INSTRUCTIONS   If you are given medicines, take them exactly as prescribed by your health care provider.  Keep all follow-up appointments as directed by your health care provider.  Do not swim or drive or engage in risky activity during which a seizure could cause further injury to you or others until your health care provider says it is OK.  Get adequate rest.  Teach friends and family what to do if you have a seizure. They should:  Lay you on the ground to prevent a fall.  Put a cushion under your head.  Loosen any tight clothing around your neck.  Turn you on your side. If vomiting occurs, this helps keep your airway clear.  Stay with you until you recover.  Know whether or not you need emergency care. SEEK IMMEDIATE MEDICAL CARE IF:  The seizure lasts longer than 5 minutes.  The seizure is severe or you do not wake up immediately after the seizure.  You have an altered mental status after the seizure.  You are having more frequent or worsening seizures. Someone should drive you to the emergency department or call local emergency  services (911 in [place] MAKE SURE YOU:  Understand these instructions.  Will watch your condition.  Will get help right away if you are not doing well or get worse.   This information is not intended to replace advice given to you by your health care provider. Make sure you discuss any questions you have with your health care provider.   Document Released: 03/03/2000 Document Revised: 03/27/2014 Document Reviewed: 10/16/2012 Elsevier Interactive Patient Education 2016 [employer].  Syncope Syncope is a medical term for fainting or passing out. This means you lose consciousness and drop to the ground. People are generally unconscious for less than 5 minutes. You may have some muscle twitches for up to 15 seconds before waking up and returning to normal. Syncope occurs more often in older adults, but it can happen to anyone. While most causes of syncope are not dangerous, syncope can be a sign of a serious medical problem. It is important to seek medical care.  CAUSES  Syncope is caused by a sudden drop in blood flow to the brain. The specific cause is often not determined. Factors that can bring on syncope include:  Taking medicines that lower blood pressure.  Sudden changes in posture, such as standing up quickly.  Taking more medicine than prescribed.  Standing in one place for too long.  Seizure disorders.  Dehydration and excessive exposure to heat.  Low blood sugar (hypoglycemia).  Straining to have a bowel movement.  Heart disease, irregular heartbeat, or other circulatory problems.  Fear, emotional distress, seeing blood, or severe pain. SYMPTOMS  Right before fainting, you may:  Feel dizzy or light-headed.  Feel nauseous.  See all white or all black in your field of vision.  Have cold, clammy skin. DIAGNOSIS  Your health care provider will ask about your symptoms, perform a physical exam, and perform an electrocardiogram (ECG) to record the electrical  activity of your heart. Your health care provider may also perform other heart or blood tests to determine the cause of your syncope which may include:  Transthoracic echocardiogram (TTE). During echocardiography, sound waves are used to evaluate how blood flows through your heart.  Transesophageal echocardiogram (TEE).  Cardiac monitoring. This allows your health care provider to monitor your heart rate and rhythm in real time.  Holter monitor. This is a portable device that records your heartbeat and can help diagnose heart arrhythmias. It allows your health care provider to track your heart activity for several days, if needed.  Stress tests by exercise or by giving medicine that makes the heart beat faster. TREATMENT  In most cases, no treatment is needed. Depending on the cause of your syncope, your health care provider may recommend changing or stopping some of your medicines. HOME CARE INSTRUCTIONS  Have someone stay with you until you feel stable.  Do not drive, use machinery, or play sports until your health care provider says it is okay.  Keep all follow-up appointments as directed by your health care provider.  Lie down right away if you start feeling like you might faint. Breathe deeply and steadily. Wait until all the symptoms have passed.  Drink enough fluids to keep your urine clear or pale yellow.  If you are taking blood pressure or heart medicine, get up slowly and take several minutes to sit and then stand. This can reduce dizziness. SEEK IMMEDIATE MEDICAL CARE IF:   You have a severe headache.  You have unusual pain in the chest, abdomen, or back.  You are bleeding from your mouth or rectum, or you have black or tarry stool.  You have an irregular or very fast heartbeat.  You have pain with breathing.  You have repeated fainting or seizure-like jerking during an episode.  You faint when sitting or lying down.  You have confusion.  You have trouble  walking.  You have severe weakness.  You have vision problems. If you fainted, call your local emergency services (911 in [place] Do not drive yourself to the hospital.    This information is not intended to replace advice given to you by your health care provider. Make sure you discuss any questions you have with your health care provider.   Document Released: 03/06/2005 Document Revised: 07/21/2014 Document Reviewed: 05/05/2011 Elsevier Interactive Patient Education 2016 [employer] Inc[employer]  Acute Pancreatitis Acute pancreatitis is a disease in which the pancreas becomes suddenly irritated (inflamed). The pancreas is a large gland behind your stomach. The pancreas makes enzymes that help digest food. The pancreas also makes 2 hormones that help control your blood sugar. Acute pancreatitis happens when the enzymes attack and damage the pancreas. Most attacks last a couple of days and can cause serious problems. HOME CARE  Follow your doctor's diet instructions. You may need to avoid alcohol and limit fat in your diet.  Eat small meals often.  Drink enough fluids to keep your pee (urine) clear or pale yellow.  Only take medicines as told by your doctor.  Avoid drinking alcohol if it caused your disease.  Do not smoke.  Get plenty of rest.  Check your blood sugar at home as told by your doctor.  Keep all doctor visits as told. GET HELP IF:  You do not get better as quickly as expected.  You have new or worsening symptoms.  You have lasting pain, weakness, or feel sick to your stomach (nauseous).  You get better and then have another pain attack. GET HELP RIGHT AWAY IF:   You are unable to eat or keep fluids down.  Your pain becomes severe.  You have a fever or lasting symptoms for more than 2 to 3 days.  You have a fever and your symptoms suddenly get worse.  Your skin or the white part of your eyes turn yellow (jaundice).  You throw up (vomit).  You feel dizzy, or  you pass out (faint).  Your blood sugar is high (over 300 mg/dL). MAKE SURE YOU:   Understand these instructions.  Will watch your condition.  Will get help right away if you are not doing well or get worse.   This information is not intended to replace advice given to you by your health care provider. Make sure you discuss any questions you have with your health care provider.   Document Released: 08/23/2007 Document Revised: 03/27/2014 Document Reviewed: 06/15/2011 Elsevier Interactive Patient Education [employer]

## 2015-08-10 NOTE — ED Provider Notes (Signed)
[facility] Emergency Department Provider Note  ____________________________________________  Time seen: Approximately 136 AM  I have reviewed the triage vital signs and the nursing notes.   HISTORY  Chief Complaint Loss of Consciousness  Patient with decreased level of responsiveness and unable to answer questions.  HPI Sabrina Drake is a 40 y.o. female who comes into the hospital today with some excessive sleepiness. According to the patient's significant other she slept all day and woke up around 9 PM. The patient's significant other reports that he then went to sleep and when he woke up there was ambulance and police officers beating at the door. According to ambulance and police the patient had called them to come and pick her up but the significant other is unsure exactly why. Per EMS the patient fell in the shower. According to the EMS chart having a panic attack and lost consciousness. The patient has a prescription for oxycodone that was filled on May 8 with 60 tablet and is currently empty. The patient came in for evaluation. I'm unable to ask her questions and she is minimally responsive and unable to answer questions.   Past Medical History  Diagnosis Date  . Asthma   . Back pain   . Migraine headache   . Fibromyalgia   . Esophageal reflux   . Arthritis     Patient Active Problem List   Diagnosis Date Noted  . Cervical herniated disc 07/16/2015  . Acute bronchitis 06/07/2015  . Dental abscess 01/13/2015  . DVT (deep venous thrombosis), left 01/13/2015  . Insomnia, persistent 06/24/2014  . Anxiety, generalized 06/24/2014  . 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 Affective disorder (HCC) 06/24/2014  . H/O arthritis 06/24/2014  . History of asthma 06/24/2014  . Thoracolumbar back pain 06/24/2014  . H/O gastrointestinal disease 06/24/2014  . Fibrositis 06/24/2014  . Fibromyalgia 06/24/2014  . History of migraine headaches 06/24/2014  .  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 Iliotibial band syndrome 10/14/2012  . Cartilage disease 10/10/2012  . Gonalgia 07/08/2012  . Bursitis of hip 09/05/2011    Past Surgical History  Procedure Laterality Date  . Knee arthroscopy Left     scoped twice  . Knee replacement Left     2016  . Carpal tunnel release Bilateral 04/16/2015    second wrist in February  . Anterior cervical decomp/discectomy fusion N/A 07/16/2015    Procedure: CERVICAL FIVE-SIX ANTERIOR CERVICAL DECOMPRESSION/DISCECTOMY FUSION ;  Surgeon: Julio Sicks, MD;  Location: [facility];  Service: Neurosurgery;  Laterality: N/A;    Current Outpatient Rx  Name  Route  Sig  Dispense  Refill  . albuterol (PROVENTIL HFA;VENTOLIN HFA) 108 (90 Base) MCG/ACT inhaler   Inhalation   Inhale 1-2 puffs into the lungs every 6 (six) hours as needed for wheezing or shortness of breath.         . gabapentin (NEURONTIN) 600 MG tablet      2 (two) Tablet every eight hours, as needed Patient taking differently: Take 1,200 mg by mouth every 8 (eight) hours as needed (pain).    180 tablet   5   . Oxycodone HCl 10 MG TABS   Oral   Take 1 tablet by mouth every 4 (four) hours.           Allergies Frovatriptan; Sumatriptan succinate; Zolmitriptan; Blue dyes (parenteral); Toradol; Shellfish allergy; and Tramadol  Family History  Problem Relation Age of Onset  . Hypertension Mother   . Skin cancer Mother   .  Heart disease Father   . Hypertension Father   . Heart disease Maternal Uncle   . Heart disease Maternal Grandfather   . Breast cancer Maternal Grandmother   . Depression Maternal Grandmother   . Stomach cancer Paternal Grandfather     Social History Social History  Substance Use Topics  . Smoking status: Passive Smoke Exposure - Never Smoker  . Smokeless tobacco: None  . Alcohol Use: No    Review of Systems  Unable to assess as patient minimally responsive. ____________________________________________   PHYSICAL  EXAM:  VITAL SIGNS: ED Triage Vitals  Enc Vitals Group     BP 08/10/15 0129 144/97 mmHg     Pulse Rate 08/10/15 0128 92     Resp 08/10/15 0128 17     Temp 08/10/15 0128 96.8 F (36 C)     Temp Source 08/10/15 0128 Axillary     SpO2 08/10/15 0128 100 %     Weight --      Height --      Head Cir --      Peak Flow --      Pain Score --      Pain Loc --      Pain Edu? --      Excl. in GC? --     Constitutional: Somnolent, well-appearing and in mild distress. Eyes: Conjunctivae are normal. PERRL. EOMI. Head: Atraumatic. Nose: No congestion/rhinnorhea. Mouth/Throat: Mucous membranes are moist.  Oropharynx non-erythematous. Cardiovascular: Normal rate, regular rhythm. Grossly normal heart sounds.  Good peripheral circulation. Respiratory: Normal respiratory effort.  No retractions. Lungs CTAB. Gastrointestinal: Soft and nontender. No distention. Positive bowel sounds Musculoskeletal: No lower extremity tenderness nor edema.   Neurologic:  Patient not mumbling. She does open her eyes to sternal Windy Fast but she is unable to follow commands at this time. The patient's pupils are about 3-4 mm and responsive equally the patient's extraocular muscles are intact.  Skin:  Skin is warm, dry and intact.  Psychiatric: Mood and affect are normal.   ____________________________________________   LABS (all labs ordered are listed, but only abnormal results are displayed)  Labs Reviewed  CBC - Abnormal; Notable for the following:    WBC 12.1 (*)    All other components within normal limits  COMPREHENSIVE METABOLIC PANEL - Abnormal; Notable for the following:    Potassium 3.2 (*)    Glucose, Bld 110 (*)    All other components within normal limits  URINALYSIS COMPLETEWITH MICROSCOPIC ([facility] ONLY) - Abnormal; Notable for the following:    Color, Urine YELLOW (*)    APPearance CLEAR (*)    Ketones, ur 1+ (*)    pH 9.0 (*)    Protein, ur 100 (*)    Squamous Epithelial / LPF 0-5 (*)    All  other components within normal limits  LIPASE, BLOOD - Abnormal; Notable for the following:    Lipase 369 (*)    All other components within normal limits  ACETAMINOPHEN LEVEL - Abnormal; Notable for the following:    Acetaminophen (Tylenol), Serum <10 (*)    All other components within normal limits  URINE DRUG SCREEN, QUALITATIVE ([facility] ONLY)  TROPONIN I  PREGNANCY, URINE  SALICYLATE LEVEL  AMMONIA  BLOOD GAS, VENOUS   ____________________________________________  EKG  ED ECG REPORT I, Rebecka Apley, the attending physician, personally viewed and interpreted this ECG.   Date: 08/10/2015  EKG Time: 127  Rate: 84  Rhythm: normal sinus rhythm  Axis: normal  Intervals:none  ST&T Change: normal  ____________________________________________  RADIOLOGY  CT head: No evidence of traumatic intracranial injury or fracture ____________________________________________   PROCEDURES  Procedure(s) performed: None  Critical Care performed: No  ____________________________________________   INITIAL IMPRESSION / ASSESSMENT AND PLAN / ED COURSE  Pertinent labs & imaging results that were available during my care of the patient were reviewed by me and considered in my medical decision making (see chart for details).  This is a 40 year old female who comes into the hospital today with loss of consciousness and decreased responsiveness. The patient's significant other was unsure what was going on at home today. The patient did have a CT scan of her head as well as blood work which was unremarkable. She did have an elevated lipase but she is not having any abdominal pain. The patient did have an episode of vomiting while she was here in the emergency department. After multiple hours patient did start to wake up. I'm concerned that the patient may have had a seizure given this prolonged time of decreased responsiveness. I discussed with the family and she does have a family member  with a history of epilepsy. I discussed that I will have them follow up with the neurologist for further evaluation. She did receive a liter of normal saline and I did give her some morphine as well as Zofran and Reglan for her headache. The patient will be discharged to home to follow-up with neurology. She was up walking and drinking without any more vomiting in the emergency department. ____________________________________________   FINAL CLINICAL IMPRESSION(S) / ED DIAGNOSES  Final diagnoses:  Syncope, unspecified syncope type  Seizure (HCC)  Acute pancreatitis, unspecified pancreatitis type      NEW MEDICATIONS STARTED DURING THIS VISIT:  New Prescriptions   No medications on file     Note:  This document was prepared using Dragon voice recognition software and may include unintentional dictation errors.    Rebecka ApleyAllison P Reveca Desmarais, MD 08/10/15 [phone]

## 2015-08-10 NOTE — ED Notes (Signed)
Pt awakening at this time, pt vomited multipel times

## 2015-08-10 NOTE — ED Notes (Signed)
This RN, Consulting civil engineercharge RN and tech attempted to obtain blood specimen, Attempts unsuccessful, Lab called to draw blood

## 2015-08-10 NOTE — ED Notes (Signed)
Pt discharged home after verbalizing understanding of discharge instructions; nad noted. 

## 2015-08-10 NOTE — ED Notes (Signed)
Pt able to sit up and take sips of sprite.  Very drowsy, PERRL (2).  Continue to monitor.

## 2015-08-10 NOTE — ED Notes (Signed)
Pt ambulated to bathroom with assistance from family. Tolerated well.

## 2015-08-10 NOTE — ED Notes (Signed)
Pt requesting pain medication before discharge. Informed pt that she has been sleeping soundly and that narcotics would be ill-advised until she has had time to get the medication out of her system. Offered to discuss tylenol or motrin with the EDP; pt refused.

## 2015-08-10 NOTE — ED Notes (Signed)
Pt from home via EMS, called EMS for a fall in the shower, per EMS pt started having a panic attack and lost consciousness, pt responds to painful stimuli. Pt has prescription for 60ct oxycodone from [facility] and bottle is empty. VSS. Hx of anxiety

## 2015-09-16 ENCOUNTER — Ambulatory Visit: Payer: Medicaid Other | Admitting: Family Medicine

## 2015-11-02 ENCOUNTER — Ambulatory Visit (INDEPENDENT_AMBULATORY_CARE_PROVIDER_SITE_OTHER): Payer: Medicaid Other | Admitting: Family Medicine

## 2015-11-02 ENCOUNTER — Encounter: Payer: Self-pay | Admitting: Family Medicine

## 2015-11-02 VITALS — BP 116/78 | HR 69 | Temp 97.8°F | Resp 18 | Wt 165.3 lb

## 2015-11-02 DIAGNOSIS — J40 Bronchitis, not specified as acute or chronic: Secondary | ICD-10-CM | POA: Diagnosis not present

## 2015-11-02 DIAGNOSIS — Z86718 Personal history of other venous thrombosis and embolism: Secondary | ICD-10-CM

## 2015-11-02 DIAGNOSIS — J45901 Unspecified asthma with (acute) exacerbation: Secondary | ICD-10-CM | POA: Diagnosis not present

## 2015-11-02 MED ORDER — FLUTICASONE FUROATE-VILANTEROL 100-25 MCG/INH IN AEPB: 1.0000 | INHALATION_SPRAY | Freq: Every day | RESPIRATORY_TRACT | 0 refills | Status: DC

## 2015-11-02 MED ORDER — AZITHROMYCIN 250 MG PO TABS: ORAL_TABLET | ORAL | 0 refills | Status: DC

## 2015-11-02 MED ORDER — PREDNISONE 20 MG PO TABS: 20.0000 mg | ORAL_TABLET | Freq: Two times a day (BID) | ORAL | 0 refills | Status: DC

## 2015-11-02 NOTE — Progress Notes (Signed)
Name: Sabrina Drake   MRN: [id number]    DOB: 1975/09/25   Date:11/02/2015       Progress Note  Subjective  Chief Complaint  Chief Complaint  Patient presents with  . Cough    for 3 months nonproductive    HPI  Cough: she has a history of asthma and recurrent bronchitis. She states symptoms started 3 months, initially a dry cough, however last week she has cold symptoms ( rhinorrhea, nasal congestion and fever ) and now cough is wet, she also has nocturnal wheezing, no SOB, feeling very tired. She has a history of DVT and had anterior cervical fusion April 2017, however denies calf pain or swelling. She has been taking Ibuprofen and Delsym without improvement of symptoms. She has also been using albuterol prn and improves the wheezing.    Patient Active Problem List   Diagnosis Date Noted  . Cervical herniated disc 07/16/2015  . History of DVT (deep vein thrombosis) 01/13/2015  . Insomnia, persistent 06/24/2014  . Anxiety, generalized 06/24/2014  . 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 Affective disorder ([place]) 06/24/2014  . History of asthma 06/24/2014  . Thoracolumbar back pain 06/24/2014  . Fibromyalgia 06/24/2014  . History of migraine headaches 06/24/2014  . 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 Iliotibial band syndrome 10/14/2012  . Cartilage disease 10/10/2012  . History of left knee surgery 07/08/2012  . Bursitis of hip 09/05/2011    Past Surgical History:  Procedure Laterality Date  . ANTERIOR CERVICAL DECOMP/DISCECTOMY FUSION N/A 07/16/2015   Procedure: CERVICAL FIVE-SIX ANTERIOR CERVICAL DECOMPRESSION/DISCECTOMY FUSION ;  Surgeon: Earnie Larsson, MD;  Location: [facility];  Service: Neurosurgery;  Laterality: N/A;  . CARPAL TUNNEL RELEASE Bilateral 04/16/2015   second wrist in February  . KNEE ARTHROSCOPY Left    scoped twice  . knee replacement Left    2016    Family History  Problem Relation Age of Onset  . Hypertension Mother   . Skin cancer Mother   . Heart disease  Father   . Hypertension Father   . Heart disease Maternal Uncle   . Heart disease Maternal Grandfather   . Breast cancer Maternal Grandmother   . Depression Maternal Grandmother   . Stomach cancer Paternal Grandfather     Social History   Social History  . Marital status: Legally Separated    Spouse name: N/A  . Number of children: N/A  . Years of education: N/A   Occupational History  . Not on file.   Social History Main Topics  . Smoking status: Current Some Day Smoker    Packs/day: 1.00    Years: 1.00    Types: Cigars  . Smokeless tobacco: Never Used  . Alcohol use No  . Drug use: No  . Sexual activity: Yes    Birth control/ protection: IUD   Other Topics Concern  . Not on file   Social History Narrative  . No narrative on file     Current Outpatient Prescriptions:  .  albuterol (PROVENTIL HFA;VENTOLIN HFA) 108 (90 Base) MCG/ACT inhaler, Inhale 1-2 puffs into the lungs every 6 (six) hours as needed for wheezing or shortness of breath., Disp: , Rfl:  .  gabapentin (NEURONTIN) 600 MG tablet, 2 (two) Tablet every eight hours, as needed (Patient taking differently: Take 1,200 mg by mouth every 8 (eight) hours as needed (pain). ), Disp: 180 tablet, Rfl: 5 .  Oxycodone HCl 10 MG TABS, Take 1 tablet by mouth every 4 (four) hours., Disp: ,  Rfl:  .  pregabalin (LYRICA) 75 MG capsule, Take 75 mg by mouth every other day., Disp: , Rfl:   Allergies  Allergen Reactions  . Frovatriptan Shortness Of Breath  . Sumatriptan Succinate Shortness Of Breath  . Zolmitriptan Shortness Of Breath  . Blue Dyes (Parenteral) Hives  . Toradol [Ketorolac Tromethamine] Swelling  . Shellfish Allergy Nausea And Vomiting  . Tramadol Nausea And Vomiting     ROS  Ten systems reviewed and is negative except as mentioned in HPI   Objective  Vitals:   11/02/15 1012  BP: 116/78  Pulse: 69  Resp: 18  Temp: 97.8 F (36.6 C)  SpO2: 97%  Weight: 165 lb 5 oz (75 kg)    Body mass index  is 30.24 kg/m.  Physical Exam  Constitutional: Patient appears well-developed and well-nourished. No distress.  HEENT: head atraumatic, normocephalic, pupils equal and reactive to light, ears : TM  neck supple, throat within normal limits Cardiovascular: Normal rate, regular rhythm and normal heart sounds.  No murmur heard. No BLE edema. Pulmonary/Chest: Effort normal and breath sounds normal. No respiratory distress. Abdominal: Soft.  There is no tenderness. Psychiatric: Patient has a normal mood and affect. behavior is normal. Judgment and thought content normal.  Recent Results (from the past 2160 hour(s))  Urinalysis complete, with microscopic ([facility] only)     Status: Abnormal   Collection Time: 08/10/15  1:56 AM  Result Value Ref Range   Color, Urine YELLOW (A) YELLOW   APPearance CLEAR (A) CLEAR   Glucose, UA NEGATIVE NEGATIVE mg/dL   Bilirubin Urine NEGATIVE NEGATIVE   Ketones, ur 1+ (A) NEGATIVE mg/dL   Specific Gravity, Urine 1.021 1.005 - 1.030   Hgb urine dipstick NEGATIVE NEGATIVE   pH 9.0 (H) 5.0 - 8.0   Protein, ur 100 (A) NEGATIVE mg/dL   Nitrite NEGATIVE NEGATIVE   Leukocytes, UA NEGATIVE NEGATIVE   RBC / HPF NONE SEEN 0 - 5 RBC/hpf   WBC, UA 0-5 0 - 5 WBC/hpf   Bacteria, UA NONE SEEN NONE SEEN   Squamous Epithelial / LPF 0-5 (A) NONE SEEN   Mucous PRESENT   Urine Drug Screen, Qualitative ([facility] only)     Status: None   Collection Time: 08/10/15  1:56 AM  Result Value Ref Range   Tricyclic, Ur Screen NONE DETECTED NONE DETECTED   Amphetamines, Ur Screen NONE DETECTED NONE DETECTED   MDMA (Ecstasy)Ur Screen NONE DETECTED NONE DETECTED   Cocaine Metabolite,Ur [place] NONE DETECTED NONE DETECTED   Opiate, Ur Screen NONE DETECTED NONE DETECTED   Phencyclidine (PCP) Ur S NONE DETECTED NONE DETECTED   Cannabinoid 50 Ng, Ur [place] NONE DETECTED NONE DETECTED   Barbiturates, Ur Screen NONE DETECTED NONE DETECTED   Benzodiazepine, Ur Scrn NONE DETECTED NONE DETECTED    Methadone Scn, Ur NONE DETECTED NONE DETECTED    Comment: (NOTE) 505  Tricyclics, urine               Cutoff 1000 ng/mL 200  Amphetamines, urine             Cutoff 1000 ng/mL 300  MDMA (Ecstasy), urine           Cutoff 500 ng/mL 400  Cocaine Metabolite, urine       Cutoff 300 ng/mL 500  Opiate, urine                   Cutoff 300 ng/mL 600  Phencyclidine (PCP), urine  Cutoff 25 ng/mL 700  Cannabinoid, urine              Cutoff 50 ng/mL 800  Barbiturates, urine             Cutoff 200 ng/mL 900  Benzodiazepine, urine           Cutoff 200 ng/mL 1000 Methadone, urine                Cutoff 300 ng/mL 1100 1200 The urine drug screen provides only a preliminary, unconfirmed 1300 analytical test result and should not be used for non-medical 1400 purposes. Clinical consideration and professional judgment should 1500 be applied to any positive drug screen result due to possible 1600 interfering substances. A more specific alternate chemical method 1700 must be used in order to obtain a confirmed analytical result.  1800 Gas chromato graphy / mass spectrometry (GC/MS) is the preferred 1900 confirmatory method.   Pregnancy, urine     Status: None   Collection Time: 08/10/15  1:56 AM  Result Value Ref Range   Preg Test, Ur NEGATIVE NEGATIVE  CBC     Status: Abnormal   Collection Time: 08/10/15  2:13 AM  Result Value Ref Range   WBC 12.1 (H) 3.6 - 11.0 K/uL   RBC 4.88 3.80 - 5.20 MIL/uL   Hemoglobin 15.0 12.0 - 16.0 g/dL   HCT 43.1 35.0 - 47.0 %   MCV 88.3 80.0 - 100.0 fL   MCH 30.8 26.0 - 34.0 pg   MCHC 34.8 32.0 - 36.0 g/dL   RDW 12.5 11.5 - 14.5 %   Platelets 231 150 - 440 K/uL  Comprehensive metabolic panel     Status: Abnormal   Collection Time: 08/10/15  2:13 AM  Result Value Ref Range   Sodium 138 135 - 145 mmol/L   Potassium 3.2 (L) 3.5 - 5.1 mmol/L   Chloride 104 101 - 111 mmol/L   CO2 24 22 - 32 mmol/L   Glucose, Bld 110 (H) 65 - 99 mg/dL   BUN 11 6 - 20 mg/dL    Creatinine, Ser 0.79 0.44 - 1.00 mg/dL   Calcium 9.6 8.9 - 10.3 mg/dL   Total Protein 7.3 6.5 - 8.1 g/dL   Albumin 4.6 3.5 - 5.0 g/dL   AST 23 15 - 41 U/L   ALT 14 14 - 54 U/L   Alkaline Phosphatase 55 38 - 126 U/L   Total Bilirubin 0.6 0.3 - 1.2 mg/dL   GFR calc non Af Amer >60 >60 mL/min   GFR calc Af Amer >60 >60 mL/min    Comment: (NOTE) The eGFR has been calculated using the CKD EPI equation. This calculation has not been validated in all clinical situations. eGFR's persistently <60 mL/min signify possible Chronic Kidney Disease.    Anion gap 10 5 - 15  Troponin I     Status: None   Collection Time: 08/10/15  2:13 AM  Result Value Ref Range   Troponin I <0.03 <0.031 ng/mL    Comment:        NO INDICATION OF MYOCARDIAL INJURY.   Lipase, blood     Status: Abnormal   Collection Time: 08/10/15  2:13 AM  Result Value Ref Range   Lipase 369 (H) 11 - 51 U/L  Acetaminophen level     Status: Abnormal   Collection Time: 08/10/15  2:13 AM  Result Value Ref Range   Acetaminophen (Tylenol), Serum <10 (L) 10 - 30 ug/mL  Comment:        THERAPEUTIC CONCENTRATIONS VARY SIGNIFICANTLY. A RANGE OF 10-30 ug/mL MAY BE AN EFFECTIVE CONCENTRATION FOR MANY PATIENTS. HOWEVER, SOME ARE BEST TREATED AT CONCENTRATIONS OUTSIDE THIS RANGE. ACETAMINOPHEN CONCENTRATIONS >150 ug/mL AT 4 HOURS AFTER INGESTION AND >50 ug/mL AT 12 HOURS AFTER INGESTION ARE OFTEN ASSOCIATED WITH TOXIC REACTIONS.   Salicylate level     Status: None   Collection Time: 08/10/15  2:13 AM  Result Value Ref Range   Salicylate Lvl <8.3 2.8 - 30.0 mg/dL  Ammonia     Status: None   Collection Time: 08/10/15  7:44 AM  Result Value Ref Range   Ammonia 35 9 - 35 umol/L      PHQ2/9: Depression screen [facility] 2/9 06/14/2015 06/07/2015  Decreased Interest 0 0  Down, Depressed, Hopeless 0 0  PHQ - 2 Score 0 0     Fall Risk: Fall Risk  06/14/2015 06/07/2015  Falls in the past year? Yes Yes  Number falls in past yr: 1  1  Injury with Fall? No No  Follow up Falls evaluation completed Falls evaluation completed      Assessment & Plan  1. Asthma exacerbation  - predniSONE (DELTASONE) 20 MG tablet; Take 1 tablet (20 mg total) by mouth 2 (two) times daily with a meal.  Dispense: 6 tablet; Refill: 0 - fluticasone furoate-vilanterol (BREO ELLIPTA) 100-25 MCG/INH AEPB; Inhale 1 puff into the lungs daily.  Dispense: 60 each; Refill: 0  2. Bronchitis  - azithromycin (ZITHROMAX Z-PAK) 250 MG tablet; Take as directed  Dispense: 6 each; Refill: 0  3. History of DVT (deep vein thrombosis)  Negative exam, it seems to be all related to URI asthma flare, but discussed symptoms of DVT

## 2015-11-04 ENCOUNTER — Telehealth: Payer: Self-pay | Admitting: Family Medicine

## 2015-11-04 ENCOUNTER — Other Ambulatory Visit: Payer: Self-pay | Admitting: Family Medicine

## 2015-11-04 MED ORDER — FLUCONAZOLE 150 MG PO TABS: 150.0000 mg | ORAL_TABLET | ORAL | 0 refills | Status: DC

## 2015-11-04 NOTE — Telephone Encounter (Signed)
done

## 2015-11-19 ENCOUNTER — Telehealth: Payer: Self-pay | Admitting: Family Medicine

## 2015-11-19 NOTE — Telephone Encounter (Signed)
Virgel BouquetBreo is a non preferred medication due to being on Medicaid, Preferred Brands are: Advair, Dulera and Symbicort. Can you please change for the patient and I will notify her once switched. Thanks

## 2015-11-23 ENCOUNTER — Other Ambulatory Visit: Payer: Self-pay | Admitting: Family Medicine

## 2015-11-23 MED ORDER — FLUTICASONE-SALMETEROL 250-50 MCG/DOSE IN AEPB: 1.0000 | INHALATION_SPRAY | Freq: Two times a day (BID) | RESPIRATORY_TRACT | 0 refills | Status: DC

## 2015-11-23 NOTE — Telephone Encounter (Signed)
Sent Advair, please notify patient

## 2015-11-23 NOTE — Telephone Encounter (Signed)
Patient notified

## 2016-02-14 ENCOUNTER — Other Ambulatory Visit: Payer: Self-pay | Admitting: Family Medicine

## 2016-02-14 NOTE — Telephone Encounter (Signed)
LMOM to schedule an appt.

## 2016-02-14 NOTE — Telephone Encounter (Signed)
Patient requesting refill of Advair to [facility]. 

## 2016-02-14 NOTE — Telephone Encounter (Signed)
Please ask pt to schedule an appt I see asthma in dx list, but no flu shot, no pneumonia shot, no spirometry I sent in refill of Advair, but please ask her to schedule a visit in the next few weeks Thank you

## 2016-02-18 ENCOUNTER — Ambulatory Visit: Payer: Medicaid Other | Admitting: Family Medicine

## 2016-06-08 ENCOUNTER — Other Ambulatory Visit: Payer: Self-pay | Admitting: Neurosurgery

## 2016-06-08 DIAGNOSIS — M50221 Other cervical disc displacement at C4-C5 level: Secondary | ICD-10-CM

## 2016-06-19 ENCOUNTER — Ambulatory Visit
Admission: RE | Admit: 2016-06-19 | Discharge: 2016-06-19 | Disposition: A | Discharge status: Home / Self Care | Payer: Medicaid Other | Source: Ambulatory Visit | Attending: Neurosurgery | Admitting: Neurosurgery

## 2016-06-19 DIAGNOSIS — M50221 Other cervical disc displacement at C4-C5 level: Secondary | ICD-10-CM

## 2016-06-19 MED ORDER — TRIAMCINOLONE ACETONIDE 40 MG/ML IJ SUSP (RADIOLOGY)
60.0000 mg | Freq: Once | INTRAMUSCULAR | Status: AC
  Administered 2016-06-19: 60 mg via EPIDURAL

## 2016-06-19 MED ORDER — IOPAMIDOL (ISOVUE-M 300) INJECTION 61%
1.0000 mL | Freq: Once | INTRAMUSCULAR | Status: AC | PRN
  Administered 2016-06-19: 1 mL via EPIDURAL

## 2016-06-19 NOTE — Discharge Instructions (Signed)

## 2016-07-12 ENCOUNTER — Other Ambulatory Visit: Payer: Self-pay | Admitting: Neurosurgery

## 2016-07-19 ENCOUNTER — Inpatient Hospital Stay (HOSPITAL_COMMUNITY): Admission: RE | Admit: 2016-07-19 | Payer: Medicaid Other | Source: Ambulatory Visit

## 2016-07-20 NOTE — Pre-Procedure Instructions (Signed)
Sabrina OrleansHeather C Drake  07/20/2016      [id number] [name] - [address] [name] [id number] Phone: [phone] Fax: [phone]    Your procedure is scheduled on   Friday  07/28/16  [facility] at 1000 A.M.  Call this number if you have problems the morning of surgery:  [phone]   Remember:  Do not eat food or drink liquids after midnight.  Take these medicines the morning of surgery with A SIP OF WATER   INHALER IF NEEDED, OXYCODONE IF NEEDED     (STOP ASPIRIN OR ASPIRIN PRODUCTS , IBUPROFEN/ ADVIL/ MOTRIN, GOODY POWDERS, BCS ,HERBAL MEDICINES)   Do not wear jewelry, make-up or nail polish.  Do not wear lotions, powders, or perfumes, or deoderant.  Do not shave 48 hours prior to surgery.  Men may shave face and neck.  Do not bring valuables to the hospital.  [facility] is not responsible for any belongings or valuables.  Contacts, dentures or bridgework may not be worn into surgery.  Leave your suitcase in the car.  After surgery it may be brought to your room.  For patients admitted to the hospital, discharge time will be determined by your treatment team.  Patients discharged the day of surgery will not be allowed to drive home.   Name and phone number of your driver:    Special instructions:  [name] - Preparing for Surgery  Before surgery, you can play an important role.  Because skin is not sterile, your skin needs to be as free of germs as possible.  You can reduce the number of germs on you skin by washing with CHG (chlorahexidine gluconate) soap before surgery.  CHG is an antiseptic cleaner which kills germs and bonds with the skin to continue killing germs even after washing.  Please DO NOT use if you have an allergy to CHG or antibacterial soaps.  If your skin becomes reddened/irritated stop using the CHG and inform your nurse when you arrive at Short Stay.  Do not shave (including legs and underarms)  for at least 48 hours prior to the first CHG shower.  You may shave your face.  Please follow these instructions carefully:   1.  Shower with CHG Soap the night before surgery and the                                morning of Surgery.  2.  If you choose to wash your hair, wash your hair first as usual with your       normal shampoo.  3.  After you shampoo, rinse your hair and body thoroughly to remove the                      Shampoo.  4.  Use CHG as you would any other liquid soap.  You can apply chg directly       to the skin and wash gently with scrungie or a clean washcloth.  5.  Apply the CHG Soap to your body ONLY FROM THE NECK DOWN.        Do not use on open wounds or open sores.  Avoid contact with your eyes,       ears, mouth and genitals (private parts).  Wash genitals (private parts)       with your  normal soap.  6.  Wash thoroughly, paying special attention to the area where your surgery        will be performed.  7.  Thoroughly rinse your body with warm water from the neck down.  8.  DO NOT shower/wash with your normal soap after using and rinsing off       the CHG Soap.  9.  Pat yourself dry with a clean towel.            10.  Wear clean pajamas.            11.  Place clean sheets on your bed the night of your first shower and do not        sleep with pets.  Day of Surgery  Do not apply any lotions/deoderants the morning of surgery.  Please wear clean clothes to the hospital/surgery center.    Please read over the following fact sheets that you were given. MRSA Information and Surgical Site Infection Prevention

## 2016-07-21 ENCOUNTER — Other Ambulatory Visit (HOSPITAL_COMMUNITY): Payer: Self-pay

## 2016-07-21 ENCOUNTER — Encounter (HOSPITAL_COMMUNITY)
Admission: RE | Admit: 2016-07-21 | Discharge: 2016-07-21 | Disposition: A | Discharge status: Home / Self Care | Payer: Medicaid Other | Source: Ambulatory Visit | Attending: Neurosurgery | Admitting: Neurosurgery

## 2016-07-21 DIAGNOSIS — M5020 Other cervical disc displacement, unspecified cervical region: Secondary | ICD-10-CM | POA: Diagnosis not present

## 2016-07-21 DIAGNOSIS — Z01812 Encounter for preprocedural laboratory examination: Secondary | ICD-10-CM | POA: Insufficient documentation

## 2016-07-21 LAB — BASIC METABOLIC PANEL
Anion gap: 5 (ref 5–15)
BUN: 9 mg/dL (ref 6–20)
CALCIUM: 9.1 mg/dL (ref 8.9–10.3)
CO2: 31 mmol/L (ref 22–32)
CREATININE: 0.85 mg/dL (ref 0.44–1.00)
Chloride: 103 mmol/L (ref 101–111)
GFR calc non Af Amer: >60 mL/min (ref >60)
Glucose, Bld: 98 mg/dL (ref 65–99)
Potassium: 4.2 mmol/L (ref 3.5–5.1)
SODIUM: 139 mmol/L (ref 135–145)

## 2016-07-21 LAB — CBC WITH DIFFERENTIAL/PLATELET
BASOS PCT: 0 %
Basophils Absolute: 0 10*3/uL (ref 0.0–0.1)
EOS ABS: 0.1 10*3/uL (ref 0.0–0.7)
Eosinophils Relative: 2 %
HCT: 41.1 % (ref 36.0–46.0)
HEMOGLOBIN: 13.9 g/dL (ref 12.0–15.0)
Lymphocytes Relative: 44 %
Lymphs Abs: 3.3 10*3/uL (ref 0.7–4.0)
MCH: 31.6 pg (ref 26.0–34.0)
MCHC: 33.8 g/dL (ref 30.0–36.0)
MCV: 93.4 fL (ref 78.0–100.0)
MONOS PCT: 5 %
Monocytes Absolute: 0.4 10*3/uL (ref 0.1–1.0)
NEUTROS PCT: 49 %
Neutro Abs: 3.7 10*3/uL (ref 1.7–7.7)
Platelets: 211 10*3/uL (ref 150–400)
RBC: 4.4 MIL/uL (ref 3.87–5.11)
RDW: 12.8 % (ref 11.5–15.5)
WBC: 7.4 10*3/uL (ref 4.0–10.5)

## 2016-07-21 LAB — SURGICAL PCR SCREEN
MRSA, PCR: NEGATIVE
Staphylococcus aureus: NEGATIVE

## 2016-07-21 LAB — HCG, SERUM, QUALITATIVE: PREG SERUM: NEGATIVE

## 2016-07-28 ENCOUNTER — Observation Stay (HOSPITAL_COMMUNITY)
Admission: RE | Admit: 2016-07-28 | Discharge: 2016-07-29 | Disposition: A | Discharge status: Home / Self Care | Payer: Medicaid Other | Source: Ambulatory Visit | Attending: Neurosurgery | Admitting: Neurosurgery

## 2016-07-28 ENCOUNTER — Ambulatory Visit (HOSPITAL_COMMUNITY): Payer: Medicaid Other | Admitting: Certified Registered Nurse Anesthetist

## 2016-07-28 ENCOUNTER — Encounter (HOSPITAL_COMMUNITY)
Admission: RE | Disposition: A | Discharge status: Home / Self Care | Payer: Self-pay | Source: Ambulatory Visit | Attending: Neurosurgery

## 2016-07-28 ENCOUNTER — Encounter (HOSPITAL_COMMUNITY): Payer: Self-pay | Admitting: Certified Registered Nurse Anesthetist

## 2016-07-28 ENCOUNTER — Ambulatory Visit (HOSPITAL_COMMUNITY): Payer: Medicaid Other

## 2016-07-28 DIAGNOSIS — Z79899 Other long term (current) drug therapy: Secondary | ICD-10-CM | POA: Diagnosis not present

## 2016-07-28 DIAGNOSIS — M5020 Other cervical disc displacement, unspecified cervical region: Secondary | ICD-10-CM | POA: Diagnosis present

## 2016-07-28 DIAGNOSIS — M50121 Cervical disc disorder at C4-C5 level with radiculopathy: Principal | ICD-10-CM | POA: Insufficient documentation

## 2016-07-28 DIAGNOSIS — M797 Fibromyalgia: Secondary | ICD-10-CM | POA: Diagnosis not present

## 2016-07-28 DIAGNOSIS — F17290 Nicotine dependence, other tobacco product, uncomplicated: Secondary | ICD-10-CM | POA: Insufficient documentation

## 2016-07-28 DIAGNOSIS — J45909 Unspecified asthma, uncomplicated: Secondary | ICD-10-CM | POA: Diagnosis not present

## 2016-07-28 DIAGNOSIS — Z981 Arthrodesis status: Secondary | ICD-10-CM | POA: Insufficient documentation

## 2016-07-28 DIAGNOSIS — Z419 Encounter for procedure for purposes other than remedying health state, unspecified: Secondary | ICD-10-CM

## 2016-07-28 DIAGNOSIS — Z7951 Long term (current) use of inhaled steroids: Secondary | ICD-10-CM | POA: Diagnosis not present

## 2016-07-28 HISTORY — PX: ANTERIOR CERVICAL DECOMP/DISCECTOMY FUSION: SHX1161

## 2016-07-28 SURGERY — ANTERIOR CERVICAL DECOMPRESSION/DISCECTOMY FUSION 1 LEVEL
Anesthesia: General | Site: Neck

## 2016-07-28 MED ORDER — HYDROMORPHONE HCL 1 MG/ML IJ SOLN
INTRAMUSCULAR | Status: AC
  Filled 2016-07-28: qty 0.5

## 2016-07-28 MED ORDER — CYCLOBENZAPRINE HCL 10 MG PO TABS
10.0000 mg | ORAL_TABLET | Freq: Three times a day (TID) | ORAL | Status: DC | PRN
  Administered 2016-07-28 – 2016-07-29 (×2): 10 mg via ORAL
  Filled 2016-07-28 (×2): qty 1

## 2016-07-28 MED ORDER — LIDOCAINE HCL (CARDIAC) 20 MG/ML IV SOLN
INTRAVENOUS | Status: DC | PRN
  Administered 2016-07-28: 100 mg via INTRAVENOUS

## 2016-07-28 MED ORDER — MENTHOL 3 MG MT LOZG: 1.0000 | LOZENGE | OROMUCOSAL | Status: DC | PRN

## 2016-07-28 MED ORDER — ONDANSETRON HCL 4 MG/2ML IJ SOLN: 4.0000 mg | Freq: Four times a day (QID) | INTRAMUSCULAR | Status: DC | PRN

## 2016-07-28 MED ORDER — SUCCINYLCHOLINE CHLORIDE 200 MG/10ML IV SOSY
PREFILLED_SYRINGE | INTRAVENOUS | Status: AC
  Filled 2016-07-28: qty 10

## 2016-07-28 MED ORDER — PHENOL 1.4 % MT LIQD: 1.0000 | OROMUCOSAL | Status: DC | PRN

## 2016-07-28 MED ORDER — CHLORHEXIDINE GLUCONATE CLOTH 2 % EX PADS: 6.0000 | MEDICATED_PAD | Freq: Once | CUTANEOUS | Status: DC

## 2016-07-28 MED ORDER — SODIUM CHLORIDE 0.9% FLUSH
3.0000 mL | Freq: Two times a day (BID) | INTRAVENOUS | Status: DC
  Administered 2016-07-28 (×2): 3 mL via INTRAVENOUS

## 2016-07-28 MED ORDER — ONDANSETRON HCL 4 MG/2ML IJ SOLN: 4.0000 mg | Freq: Once | INTRAMUSCULAR | Status: DC | PRN

## 2016-07-28 MED ORDER — HYDROCODONE-ACETAMINOPHEN 5-325 MG PO TABS
1.0000 | ORAL_TABLET | ORAL | Status: DC | PRN
  Administered 2016-07-29: 2 via ORAL
  Filled 2016-07-28: qty 2

## 2016-07-28 MED ORDER — THROMBIN 5000 UNITS EX SOLR
CUTANEOUS | Status: DC | PRN
  Administered 2016-07-28 (×2): 5000 [IU] via TOPICAL

## 2016-07-28 MED ORDER — OXYCODONE HCL 5 MG PO TABS
10.0000 mg | ORAL_TABLET | Freq: Four times a day (QID) | ORAL | Status: DC
  Administered 2016-07-28 – 2016-07-29 (×3): 10 mg via ORAL
  Filled 2016-07-28 (×3): qty 2

## 2016-07-28 MED ORDER — THROMBIN 5000 UNITS EX SOLR
CUTANEOUS | Status: AC
  Filled 2016-07-28: qty 15000

## 2016-07-28 MED ORDER — CEFAZOLIN SODIUM-DEXTROSE 2-4 GM/100ML-% IV SOLN
2.0000 g | INTRAVENOUS | Status: AC
  Administered 2016-07-28: 2 g via INTRAVENOUS
  Filled 2016-07-28: qty 100

## 2016-07-28 MED ORDER — HYDROMORPHONE HCL 1 MG/ML IJ SOLN
0.2500 mg | INTRAMUSCULAR | Status: DC | PRN
  Administered 2016-07-28 (×3): 0.5 mg via INTRAVENOUS

## 2016-07-28 MED ORDER — HEMOSTATIC AGENTS (NO CHARGE) OPTIME
TOPICAL | Status: DC | PRN
  Administered 2016-07-28: 1 via TOPICAL

## 2016-07-28 MED ORDER — SUGAMMADEX SODIUM 200 MG/2ML IV SOLN
INTRAVENOUS | Status: DC | PRN
Start: 2016-07-28 — End: 2016-07-28
  Administered 2016-07-28: 200 mg via INTRAVENOUS

## 2016-07-28 MED ORDER — CEFAZOLIN SODIUM-DEXTROSE 1-4 GM/50ML-% IV SOLN
1.0000 g | Freq: Three times a day (TID) | INTRAVENOUS | Status: AC
  Administered 2016-07-28 – 2016-07-29 (×2): 1 g via INTRAVENOUS
  Filled 2016-07-28 (×2): qty 50

## 2016-07-28 MED ORDER — GABAPENTIN 400 MG PO CAPS
1200.0000 mg | ORAL_CAPSULE | Freq: Three times a day (TID) | ORAL | Status: DC
  Administered 2016-07-28 – 2016-07-29 (×3): 1200 mg via ORAL
  Filled 2016-07-28 (×3): qty 3

## 2016-07-28 MED ORDER — ONDANSETRON HCL 4 MG/2ML IJ SOLN
INTRAMUSCULAR | Status: DC | PRN
  Administered 2016-07-28: 4 mg via INTRAVENOUS

## 2016-07-28 MED ORDER — EPHEDRINE 5 MG/ML INJ
INTRAVENOUS | Status: AC
  Filled 2016-07-28: qty 10

## 2016-07-28 MED ORDER — ALBUTEROL SULFATE (2.5 MG/3ML) 0.083% IN NEBU
2.5000 mg | INHALATION_SOLUTION | Freq: Four times a day (QID) | RESPIRATORY_TRACT | Status: DC | PRN
Start: 2016-07-28 — End: 2016-07-29

## 2016-07-28 MED ORDER — 0.9 % SODIUM CHLORIDE (POUR BTL) OPTIME
TOPICAL | Status: DC | PRN
  Administered 2016-07-28: 1000 mL

## 2016-07-28 MED ORDER — DEXAMETHASONE SODIUM PHOSPHATE 10 MG/ML IJ SOLN
10.0000 mg | INTRAMUSCULAR | Status: AC
  Administered 2016-07-28: 10 mg via INTRAVENOUS

## 2016-07-28 MED ORDER — MIDAZOLAM HCL 2 MG/2ML IJ SOLN
INTRAMUSCULAR | Status: AC
  Filled 2016-07-28: qty 2

## 2016-07-28 MED ORDER — FENTANYL CITRATE (PF) 250 MCG/5ML IJ SOLN
INTRAMUSCULAR | Status: AC
  Filled 2016-07-28: qty 5

## 2016-07-28 MED ORDER — SODIUM CHLORIDE 0.9% FLUSH: 3.0000 mL | INTRAVENOUS | Status: DC | PRN

## 2016-07-28 MED ORDER — PROPOFOL 10 MG/ML IV BOLUS
INTRAVENOUS | Status: AC
  Filled 2016-07-28: qty 20

## 2016-07-28 MED ORDER — FENTANYL CITRATE (PF) 100 MCG/2ML IJ SOLN
INTRAMUSCULAR | Status: DC | PRN
  Administered 2016-07-28: 50 ug via INTRAVENOUS
  Administered 2016-07-28: 150 ug via INTRAVENOUS
  Administered 2016-07-28: 50 ug via INTRAVENOUS

## 2016-07-28 MED ORDER — HYDROMORPHONE HCL 1 MG/ML IJ SOLN: 0.5000 mg | INTRAMUSCULAR | Status: DC | PRN

## 2016-07-28 MED ORDER — ONDANSETRON HCL 4 MG/2ML IJ SOLN
INTRAMUSCULAR | Status: AC
  Filled 2016-07-28: qty 2

## 2016-07-28 MED ORDER — BACITRACIN 50000 UNITS IM SOLR
INTRAMUSCULAR | Status: DC | PRN
  Administered 2016-07-28: 12:00:00

## 2016-07-28 MED ORDER — ROCURONIUM BROMIDE 100 MG/10ML IV SOLN
INTRAVENOUS | Status: DC | PRN
  Administered 2016-07-28: 50 mg via INTRAVENOUS

## 2016-07-28 MED ORDER — ONDANSETRON HCL 4 MG PO TABS: 4.0000 mg | ORAL_TABLET | Freq: Four times a day (QID) | ORAL | Status: DC | PRN

## 2016-07-28 MED ORDER — MIDAZOLAM HCL 5 MG/5ML IJ SOLN
INTRAMUSCULAR | Status: DC | PRN
  Administered 2016-07-28: 2 mg via INTRAVENOUS

## 2016-07-28 MED ORDER — MEPERIDINE HCL 25 MG/ML IJ SOLN: 6.2500 mg | INTRAMUSCULAR | Status: DC | PRN

## 2016-07-28 MED ORDER — LACTATED RINGERS IV SOLN
INTRAVENOUS | Status: DC
  Administered 2016-07-28: 50 mL/h via INTRAVENOUS
  Administered 2016-07-28: 13:00:00 via INTRAVENOUS

## 2016-07-28 MED ORDER — PROPOFOL 10 MG/ML IV BOLUS
INTRAVENOUS | Status: DC | PRN
  Administered 2016-07-28: 120 mg via INTRAVENOUS

## 2016-07-28 SURGICAL SUPPLY — 59 items
BAG DECANTER FOR FLEXI CONT (MISCELLANEOUS) ×3 IMPLANT
BENZOIN TINCTURE PRP APPL 2/3 (GAUZE/BANDAGES/DRESSINGS) ×3 IMPLANT
BIT DRILL 13 (BIT) ×2 IMPLANT
BIT DRILL 13MM (BIT) ×1
BUR MATCHSTICK NEURO 3.0 LAGG (BURR) ×3 IMPLANT
CAGE PEEK 6X14X11 (Cage) ×2 IMPLANT
CAGE SPNL 11X14X6XRADOPQ (Cage) ×1 IMPLANT
CANISTER SUCT 3000ML PPV (MISCELLANEOUS) ×3 IMPLANT
CARTRIDGE OIL MAESTRO DRILL (MISCELLANEOUS) ×1 IMPLANT
CLOSURE WOUND 1/2 X4 (GAUZE/BANDAGES/DRESSINGS) ×1
DIFFUSER DRILL AIR PNEUMATIC (MISCELLANEOUS) ×3 IMPLANT
DRAPE C-ARM 42X72 X-RAY (DRAPES) ×6 IMPLANT
DRAPE LAPAROTOMY 100X72 PEDS (DRAPES) ×3 IMPLANT
DRAPE MICROSCOPE LEICA (MISCELLANEOUS) ×3 IMPLANT
DRAPE POUCH INSTRU U-SHP 10X18 (DRAPES) ×3 IMPLANT
DRSG OPSITE POSTOP 4X6 (GAUZE/BANDAGES/DRESSINGS) ×3 IMPLANT
DURAPREP 6ML APPLICATOR 50/CS (WOUND CARE) ×3 IMPLANT
ELECT COATED BLADE 2.86 ST (ELECTRODE) ×3 IMPLANT
ELECT REM PT RETURN 9FT ADLT (ELECTROSURGICAL) ×3
ELECTRODE REM PT RTRN 9FT ADLT (ELECTROSURGICAL) ×1 IMPLANT
GAUZE SPONGE 4X4 12PLY STRL (GAUZE/BANDAGES/DRESSINGS) ×3 IMPLANT
GAUZE SPONGE 4X4 16PLY XRAY LF (GAUZE/BANDAGES/DRESSINGS) IMPLANT
GLOVE BIOGEL PI IND STRL 7.5 (GLOVE) ×1 IMPLANT
GLOVE BIOGEL PI IND STRL 8 (GLOVE) ×3 IMPLANT
GLOVE BIOGEL PI INDICATOR 7.5 (GLOVE) ×2
GLOVE BIOGEL PI INDICATOR 8 (GLOVE) ×6
GLOVE ECLIPSE 9.0 STRL (GLOVE) ×3 IMPLANT
GLOVE EXAM NITRILE LRG STRL (GLOVE) IMPLANT
GLOVE EXAM NITRILE XL STR (GLOVE) IMPLANT
GLOVE EXAM NITRILE XS STR PU (GLOVE) IMPLANT
GLOVE SS BIOGEL STRL SZ 7.5 (GLOVE) ×1 IMPLANT
GLOVE SUPERSENSE BIOGEL SZ 7.5 (GLOVE) ×2
GOWN STRL REUS W/ TWL LRG LVL3 (GOWN DISPOSABLE) ×1 IMPLANT
GOWN STRL REUS W/ TWL XL LVL3 (GOWN DISPOSABLE) ×1 IMPLANT
GOWN STRL REUS W/TWL 2XL LVL3 (GOWN DISPOSABLE) ×3 IMPLANT
GOWN STRL REUS W/TWL LRG LVL3 (GOWN DISPOSABLE) ×2
GOWN STRL REUS W/TWL XL LVL3 (GOWN DISPOSABLE) ×2
HALTER HD/CHIN CERV TRACTION D (MISCELLANEOUS) ×3 IMPLANT
KIT BASIN OR (CUSTOM PROCEDURE TRAY) ×3 IMPLANT
KIT ROOM TURNOVER OR (KITS) ×3 IMPLANT
NEEDLE SPNL 20GX3.5 QUINCKE YW (NEEDLE) ×3 IMPLANT
NS IRRIG 1000ML POUR BTL (IV SOLUTION) ×3 IMPLANT
OIL CARTRIDGE MAESTRO DRILL (MISCELLANEOUS) ×3
PACK LAMINECTOMY NEURO (CUSTOM PROCEDURE TRAY) ×3 IMPLANT
PAD ARMBOARD 7.5X6 YLW CONV (MISCELLANEOUS) ×9 IMPLANT
PLATE 23MM (Plate) ×3 IMPLANT
RUBBERBAND STERILE (MISCELLANEOUS) ×6 IMPLANT
SCREW ST 13X4XST VA NS SPNE (Screw) ×4 IMPLANT
SCREW ST VAR 4 ATL (Screw) ×8 IMPLANT
SPONGE INTESTINAL PEANUT (DISPOSABLE) ×3 IMPLANT
SPONGE SURGIFOAM ABS GEL SZ50 (HEMOSTASIS) ×3 IMPLANT
STRIP CLOSURE SKIN 1/2X4 (GAUZE/BANDAGES/DRESSINGS) ×2 IMPLANT
SUT VIC AB 3-0 SH 8-18 (SUTURE) ×3 IMPLANT
SUT VIC AB 4-0 RB1 18 (SUTURE) ×3 IMPLANT
TAPE CLOTH 4X10 WHT NS (GAUZE/BANDAGES/DRESSINGS) IMPLANT
TOWEL GREEN STERILE (TOWEL DISPOSABLE) ×2 IMPLANT
TOWEL GREEN STERILE FF (TOWEL DISPOSABLE) ×3 IMPLANT
TRAP SPECIMEN MUCOUS 40CC (MISCELLANEOUS) ×3 IMPLANT
WATER STERILE IRR 1000ML POUR (IV SOLUTION) ×3 IMPLANT

## 2016-07-28 NOTE — Progress Notes (Signed)
Orthopedic Tech Progress Note Patient Details:  Sabrina OrleansHeather C Drake 11/17/1975 [id number]  Ortho Devices Type of Ortho Device: CAM walker, Soft collar Ortho Device/Splint Interventions: Application   Saul FordyceJennifer C Braya Habermehl 07/28/2016, 1:54 PM

## 2016-07-28 NOTE — Anesthesia Procedure Notes (Signed)
Procedure Name: Intubation Date/Time: 07/28/2016 11:52 AM Performed by: Carney Living Pre-anesthesia Checklist: Patient identified, Emergency Drugs available, Suction available, Patient being monitored and Timeout performed Patient Re-evaluated:Patient Re-evaluated prior to inductionOxygen Delivery Method: Circle system utilized Preoxygenation: Pre-oxygenation with 100% oxygen Intubation Type: IV induction Ventilation: Mask ventilation without difficulty Laryngoscope Size: Mac and 4 Grade View: Grade I Tube type: Oral Tube size: 7.0 mm Number of attempts: 1 Airway Equipment and Method: Stylet Placement Confirmation: ETT inserted through vocal cords under direct vision,  positive ETCO2 and breath sounds checked- equal and bilateral Secured at: 22 cm Tube secured with: Tape Dental Injury: Teeth and Oropharynx as per pre-operative assessment  Comments: Intubation easily performed with minimal cervical extension as above, VSS

## 2016-07-28 NOTE — Transfer of Care (Signed)
Immediate Anesthesia Transfer of Care Note  Patient: Sabrina Drake  Procedure(s) Performed: Procedure(s): Anterior Cervical Decompression Fusion Cervical four-five (N/A)  Patient Location: PACU  Anesthesia Type:General  Level of Consciousness: awake, alert , oriented and patient cooperative  Airway & Oxygen Therapy: Patient Spontanous Breathing and Patient connected to nasal cannula oxygen  Post-op Assessment: Report given to RN, Post -op Vital signs reviewed and stable and Patient moving all extremities X 4  Post vital signs: Reviewed and stable  Last Vitals:  Vitals:   07/28/16 1024  BP: (!) 144/84  Pulse: 71  Resp: 20  Temp: 37 C    Last Pain:  Vitals:   07/28/16 1024  TempSrc: Oral  PainSc: 3       Patients Stated Pain Goal: 5 (07/28/16 1024)  Complications: No apparent anesthesia complications

## 2016-07-28 NOTE — Anesthesia Postprocedure Evaluation (Signed)
Anesthesia Post Note  Patient: Danae OrleansHeather C Plouff  Procedure(s) Performed: Procedure(s) (LRB): Anterior Cervical Decompression Fusion Cervical four-five (N/A)  Patient location during evaluation: PACU Anesthesia Type: General Level of consciousness: awake and alert Pain management: pain level controlled Vital Signs Assessment: post-procedure vital signs reviewed and stable Respiratory status: spontaneous breathing, nonlabored ventilation, respiratory function stable and patient connected to nasal cannula oxygen Cardiovascular status: blood pressure returned to baseline and stable Postop Assessment: no signs of nausea or vomiting Anesthetic complications: no       Last Vitals:  Vitals:   07/28/16 1450 07/28/16 1515  BP:  134/88  Pulse:  (!) 57  Resp:  18  Temp: 36.7 C 37 C    Last Pain:  Vitals:   07/28/16 1430  TempSrc:   PainSc: Asleep                 Jamare Vanatta DAVID

## 2016-07-28 NOTE — Brief Op Note (Signed)
07/28/2016  12:56 PM  PATIENT:  Sabrina OrleansHeather C Drake  41 y.o. female  PRE-OPERATIVE DIAGNOSIS:  Herniated Nucleus Pulposus  POST-OPERATIVE DIAGNOSIS:  Herniated Nucleus Pulposus  PROCEDURE:  Procedure(s): Anterior Cervical Decompression Fusion Cervical four-five (N/A)  SURGEON:  Surgeon(s) and Role:    * Julio SicksPool, Carston Riedl, MD - Primary    * Ditty, Loura HaltBenjamin Jared, MD - Assisting  PHYSICIAN ASSISTANT:   ASSISTANTS:    ANESTHESIA:   epidural and general  EBL:  No intake/output data recorded.  BLOOD ADMINISTERED:none  DRAINS: none   LOCAL MEDICATIONS USED:  NONE  SPECIMEN:  No Specimen  DISPOSITION OF SPECIMEN:  N/A  COUNTS:  YES  TOURNIQUET:  * No tourniquets in log *  DICTATION: .Dragon Dictation  PLAN OF CARE: Admit for overnight observation  PATIENT DISPOSITION:  PACU - hemodynamically stable.   Delay start of Pharmacological VTE agent (>24hrs) due to surgical blood loss or risk of bleeding: yes

## 2016-07-28 NOTE — Op Note (Signed)
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  Reexploration of C5-6 anterior cervical fusion with removal of hardware  Surgeon:Leaman Abe A.Tabbetha Kutscher, M.D.  Asst. Surgeon: Ditty  Anesthesia: General  Indication: 41 year old female status post prior C5-6 anterior cervical discectomy infusion with reasonably good results. Patient presents now for worsening neck pain left upper extremity pain and associated headaches. Workup demonstrates evidence of a broad-based leftward C4-5 disc herniation. Patient is failed conservative management. She presents now for C4-5 anterior cervical discectomy and fusion in hopes of improving her symptoms.  Operative note: After induction of anesthesia, patient position supine with neck slightly extended and held in place of halter traction. Anterior cervical regions prepped draped sterilely. Incision made overlying C5. Dissection performed on the right. Retractor placed. Fluoroscopy used. Levels confirmed. Previously placed anterior cervical plate at N8-2C5-6 was dissected free. Plate was disassembled. Fusion was inspected and found to be solid. Discectomies and performed at C4-5. Disc spaces incised. Disc material removed with various instruments down to level of the posterior annulus. Microscope then brought into the field use at the remainder of the discectomy. Remaining aspects of annulus and osteophytes removed using high-speed drill down to the level posterior longitudinal limb. Posterior lateral and was elevated and resected piecemeal fashion. On thecal sac was then identified. Wide  central decompression and perform undercutting the bodies of C4 and C5. Decompression then proceeded into each neural foramen. Wide anterior foraminotomies were performed on course exiting C5 nerve roots bilaterally. At this point a very thorough decompression had been achieved. There was no evidence of injury to the thecal sac or nerve roots. Wound is then irrigated with and like solution. Gelfoam placed topically for hemostasis found to be good. 6 mm Medtronic anatomic peek cage was packed with locally harvested autograft. This is then impacted into place and recessed slightly from the anterior cortical margin. 23 mm Atlantis anterior cervical plate was then placed over the C4 and C5 levels. This an attachment or fluoroscopic guidance using 13 mm variable-angle screws 2 each at both levels. All 4 screws given a final tightening found be solidly within the bone. Locking screws engaged both levels. Final images revealed good position the bone graft and hardware at proper upper level with normal lamina spine. Is irrigated one last time. Hemostasis was assured with bipolar chart. Wounds and close in layers with Vicryl sutures. Steri-Strips sterile dressing were applied. No apparent complications. Patient tolerated the procedure well and she returns to the recovery room postop.

## 2016-07-28 NOTE — H&P (Signed)
Sabrina Drake is an 41 y.o. female.   Chief Complaint: Neck pain HPI: 41 year old female status post prior C5-6 anterior cervical decompression infusion presents with progressive neck pain with radiation into her left shoulder and upper extremity. Workup demonstrates evidence of a focal left-sided C4-5 disc herniation. Patient has failed conservative management. She presents now for C4-5 anterior cervical discectomy and fusion in hopes of improving her symptoms.  Past Medical History:  Diagnosis Date  . Arthritis   . Asthma   . Back pain   . Esophageal reflux   . Fibromyalgia   . Migraine headache     Past Surgical History:  Procedure Laterality Date  . ANTERIOR CERVICAL DECOMP/DISCECTOMY FUSION N/A 07/16/2015   Procedure: CERVICAL FIVE-SIX ANTERIOR CERVICAL DECOMPRESSION/DISCECTOMY FUSION ;  Surgeon: Julio Sicks, MD;  Location: [facility];  Service: Neurosurgery;  Laterality: N/A;  . CARPAL TUNNEL RELEASE Bilateral 04/16/2015   second wrist in February  . KNEE ARTHROSCOPY Left    scoped twice  . knee replacement Left    2016    Family History  Problem Relation Age of Onset  . Hypertension Mother   . Skin cancer Mother   . Heart disease Father   . Hypertension Father   . Heart disease Maternal Uncle   . Heart disease Maternal Grandfather   . Breast cancer Maternal Grandmother   . Depression Maternal Grandmother   . Stomach cancer Paternal Grandfather    Social History:  reports that she has been smoking Cigars.  She has a 1.00 pack-year smoking history. She has never used smokeless tobacco. She reports that she does not drink alcohol or use drugs.  Allergies:  Allergies  Allergen Reactions  . Frovatriptan Shortness Of Breath  . Sumatriptan Succinate Shortness Of Breath  . Zolmitriptan Shortness Of Breath  . Blue Dyes (Parenteral) Hives  . Toradol [Ketorolac Tromethamine] Swelling    SWELLING REACTION UNSPECIFIED   . Shellfish Allergy Nausea And Vomiting  .  Tramadol Nausea And Vomiting  . Tylenol [Acetaminophen] Nausea And Vomiting    Medications Prior to Admission  Medication Sig Dispense Refill  . albuterol (PROVENTIL HFA;VENTOLIN HFA) 108 (90 Base) MCG/ACT inhaler Inhale 1-2 puffs into the lungs every 6 (six) hours as needed for wheezing or shortness of breath.    . gabapentin (NEURONTIN) 600 MG tablet 2 (two) Tablet every eight hours, as needed (Patient taking differently: Take 600 mg by mouth 3 (three) times daily. ) 180 tablet 5  . Oxycodone HCl 10 MG TABS Take 1 tablet by mouth 4 (four) times daily.     Marland Kitchen ADVAIR DISKUS 250-50 MCG/DOSE AEPB INHALE 1 PUFF INTO THE LUNGS 2 TIMES DAILY (Patient not taking: Reported on 07/14/2016) 60 each 0    No results found for this or any previous visit (from the past 48 hour(s)). No results found.  Pertinent items noted in HPI and remainder of comprehensive ROS otherwise negative.  Blood pressure (!) 144/84, pulse 71, temperature 98.6 F (37 C), temperature source Oral, resp. rate 20, SpO2 98 %.  Patient is awake and alert. She is oriented and appropriate. Her cranial nerve function is intact. Her motor sensory function of her extremities are normal. Wound clean and dry. Chest and abdomen benign. Extremities free from injury deformity. Assessment/Plan Left C4-C5 herniated mucous pulposis with neck pain and intermittent radiculopathy. Plan C4-5 anterior cervical discectomy with interbody fusion utilizing interbody peek cage, locally harvested autograft, and anterior plate instrumentation. Risks and benefits of been explained. Patient wishes  to proceed.  Cambria Osten A 07/28/2016, 11:07 AM

## 2016-07-28 NOTE — Anesthesia Preprocedure Evaluation (Addendum)
Anesthesia Evaluation  Patient identified by MRN, date of birth, ID band Patient awake    Reviewed: Allergy & Precautions, NPO status , Patient's Chart, lab work & pertinent test results  Airway Mallampati: II  TM Distance: >3 FB Neck ROM: Full    Dental  (+) Teeth Intact, Dental Advisory Given   Pulmonary asthma , Current Smoker,    Pulmonary exam normal        Cardiovascular Normal cardiovascular exam     Neuro/Psych  Headaches, Anxiety    GI/Hepatic GERD  Controlled and Medicated,  Endo/Other    Renal/GU      Musculoskeletal  (+) Arthritis , Osteoarthritis,  Fibromyalgia -  Abdominal   Peds  Hematology   Anesthesia Other Findings   Reproductive/Obstetrics                            Anesthesia Physical Anesthesia Plan  ASA: II  Anesthesia Plan: General   Post-op Pain Management:    Induction: Intravenous  Airway Management Planned: Oral ETT  Additional Equipment:   Intra-op Plan:   Post-operative Plan: Extubation in OR  Informed Consent: I have reviewed the patients History and Physical, chart, labs and discussed the procedure including the risks, benefits and alternatives for the proposed anesthesia with the patient or authorized representative who has indicated his/her understanding and acceptance.   Dental advisory given  Plan Discussed with: CRNA and Surgeon  Anesthesia Plan Comments:        Anesthesia Quick Evaluation

## 2016-07-29 DIAGNOSIS — M50121 Cervical disc disorder at C4-C5 level with radiculopathy: Secondary | ICD-10-CM | POA: Diagnosis not present

## 2016-07-29 MED ORDER — CYCLOBENZAPRINE HCL 10 MG PO TABS: 10.0000 mg | ORAL_TABLET | Freq: Three times a day (TID) | ORAL | 2 refills | Status: AC | PRN

## 2016-07-29 MED ORDER — OXYCODONE HCL 10 MG PO TABS: 10.0000 mg | ORAL_TABLET | Freq: Four times a day (QID) | ORAL | 0 refills | Status: AC

## 2016-07-29 NOTE — Discharge Summary (Signed)
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  Reexploration of C5-6 anterior cervical fusion with removal of hardware  Attending: Julio SicksPool, Henry, MD  Hospital Course:  The patient was admitted for the above listed operation and had an uncomplicated post-operative course.  They were discharged in stable condition.  Follow up: 3 weeks  Allergies as of 07/29/2016      Reactions   Frovatriptan Shortness Of Breath   Sumatriptan Succinate Shortness Of Breath   Zolmitriptan Shortness Of Breath   Blue Dyes (parenteral) Hives   Toradol [ketorolac Tromethamine] Swelling   SWELLING REACTION UNSPECIFIED    Shellfish Allergy Nausea And Vomiting   Tramadol Nausea And Vomiting   Tylenol [acetaminophen] Nausea And Vomiting      Medication List    TAKE these medications   ADVAIR DISKUS 250-50 MCG/DOSE Aepb Generic drug:  Fluticasone-Salmeterol INHALE 1 PUFF INTO THE LUNGS 2 TIMES DAILY   albuterol 108 (90 Base) MCG/ACT inhaler Commonly known as:  PROVENTIL HFA;VENTOLIN HFA Inhale 1-2 puffs into the lungs every 6 (six) hours as needed for wheezing or shortness of breath.   cyclobenzaprine 10 MG tablet Commonly known as:  FLEXERIL Take 1 tablet (10 mg total) by mouth 3 (three) times daily as needed for muscle spasms.   gabapentin 600 MG tablet Commonly known as:  NEURONTIN 2 (two) Tablet every eight hours, as needed What changed:  how much to take  how to take this  when to take this  additional instructions   Oxycodone HCl 10 MG Tabs Take 1-2 tablets (10-20 mg total) by mouth 4 (four) times daily. What  changed:  how much to take

## 2016-07-31 ENCOUNTER — Encounter (HOSPITAL_COMMUNITY): Payer: Self-pay | Admitting: Neurosurgery

## 2016-09-21 ENCOUNTER — Encounter: Payer: Self-pay | Admitting: Family Medicine

## 2017-03-27 ENCOUNTER — Encounter (HOSPITAL_COMMUNITY): Payer: Self-pay | Admitting: Emergency Medicine

## 2017-03-27 ENCOUNTER — Emergency Department (HOSPITAL_COMMUNITY): Payer: Medicaid Other

## 2017-03-27 ENCOUNTER — Emergency Department (HOSPITAL_COMMUNITY)
Admission: EM | Admit: 2017-03-27 | Discharge: 2017-03-28 | Disposition: A | Discharge status: Home / Self Care | Payer: Medicaid Other | Attending: Emergency Medicine | Admitting: Emergency Medicine

## 2017-03-27 DIAGNOSIS — T50901A Poisoning by unspecified drugs, medicaments and biological substances, accidental (unintentional), initial encounter: Secondary | ICD-10-CM | POA: Insufficient documentation

## 2017-03-27 DIAGNOSIS — J45909 Unspecified asthma, uncomplicated: Secondary | ICD-10-CM | POA: Diagnosis not present

## 2017-03-27 DIAGNOSIS — Z96652 Presence of left artificial knee joint: Secondary | ICD-10-CM | POA: Insufficient documentation

## 2017-03-27 DIAGNOSIS — R4182 Altered mental status, unspecified: Secondary | ICD-10-CM | POA: Diagnosis present

## 2017-03-27 DIAGNOSIS — F17290 Nicotine dependence, other tobacco product, uncomplicated: Secondary | ICD-10-CM | POA: Diagnosis not present

## 2017-03-27 LAB — SALICYLATE LEVEL

## 2017-03-27 LAB — CBG MONITORING, ED: GLUCOSE-CAPILLARY: 105 mg/dL — AB (ref 65–99)

## 2017-03-27 LAB — CBC WITH DIFFERENTIAL/PLATELET
BASOS ABS: 0 10*3/uL (ref 0.0–0.1)
BASOS PCT: 0 %
EOS PCT: 1 %
Eosinophils Absolute: 0 10*3/uL (ref 0.0–0.7)
HCT: 42 % (ref 36.0–46.0)
Hemoglobin: 14.6 g/dL (ref 12.0–15.0)
Lymphocytes Relative: 17 %
Lymphs Abs: 1.3 10*3/uL (ref 0.7–4.0)
MCH: 31.9 pg (ref 26.0–34.0)
MCHC: 34.8 g/dL (ref 30.0–36.0)
MCV: 91.7 fL (ref 78.0–100.0)
MONO ABS: 0.3 10*3/uL (ref 0.1–1.0)
MONOS PCT: 4 %
Neutro Abs: 6.1 10*3/uL (ref 1.7–7.7)
Neutrophils Relative %: 78 %
PLATELETS: 175 10*3/uL (ref 150–400)
RBC: 4.58 MIL/uL (ref 3.87–5.11)
RDW: 12.5 % (ref 11.5–15.5)
WBC: 7.7 10*3/uL (ref 4.0–10.5)

## 2017-03-27 LAB — COMPREHENSIVE METABOLIC PANEL
ALK PHOS: 48 U/L (ref 38–126)
ALT: 15 U/L (ref 14–54)
AST: 20 U/L (ref 15–41)
Albumin: 3.8 g/dL (ref 3.5–5.0)
Anion gap: 7 (ref 5–15)
BILIRUBIN TOTAL: 0.8 mg/dL (ref 0.3–1.2)
BUN: 15 mg/dL (ref 6–20)
CALCIUM: 8.9 mg/dL (ref 8.9–10.3)
CO2: 24 mmol/L (ref 22–32)
CREATININE: 0.81 mg/dL (ref 0.44–1.00)
Chloride: 107 mmol/L (ref 101–111)
GFR calc Af Amer: >60 mL/min (ref >60)
GLUCOSE: 93 mg/dL (ref 65–99)
POTASSIUM: 3.7 mmol/L (ref 3.5–5.1)
Sodium: 138 mmol/L (ref 135–145)
TOTAL PROTEIN: 6.4 g/dL — AB (ref 6.5–8.1)

## 2017-03-27 LAB — ACETAMINOPHEN LEVEL

## 2017-03-27 LAB — I-STAT BETA HCG BLOOD, ED (MC, WL, AP ONLY): I-stat hCG, quantitative: <5 m[IU]/mL (ref <5)

## 2017-03-27 LAB — ETHANOL: Alcohol, Ethyl (B): <10 mg/dL (ref <10)

## 2017-03-27 MED ORDER — NALOXONE HCL 0.4 MG/ML IJ SOLN
0.4000 mg | Freq: Once | INTRAMUSCULAR | Status: AC
  Administered 2017-03-27: 0.4 mg via INTRAVENOUS

## 2017-03-27 NOTE — ED Notes (Signed)
ED Provider at bedside. 

## 2017-03-27 NOTE — ED Provider Notes (Signed)
[facility] EMERGENCY DEPARTMENT Provider Note   CSN: [id number] Arrival date & time: 03/27/17  1829     History   Chief Complaint Chief Complaint  Patient presents with  . Drug Overdose    HPI Sabrina Drake is a 42 y.o. female.  HPI  Patient presents unresponsive. Level 5 caveat. The patient herself responds only minimally, verbally to a very brisk sternal rub, cannot answer any details of her HPI. Per report the patient was at a hotel, when another person at the hotel called a third person to pick up the patient due to altered mental status. That third individual brought the patient to our facility, but then fled the scene. There are no other reports of what occurred prior to the patient's arrival.   Past Medical History:  Diagnosis Date  . Arthritis   . Asthma   . Back pain   . Esophageal reflux   . Fibromyalgia   . Migraine headache     Patient Active Problem List   Diagnosis Date Noted  . Herniated disc, cervical 07/28/2016  . Cervical herniated disc 07/16/2015  . History of DVT (deep vein thrombosis) 01/13/2015  . Insomnia, persistent 06/24/2014  . Anxiety, generalized 06/24/2014  . 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 Affective disorder (HCC) 06/24/2014  . History of asthma 06/24/2014  . Thoracolumbar back pain 06/24/2014  . Fibromyalgia 06/24/2014  . History of migraine headaches 06/24/2014  . 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 Iliotibial band syndrome 10/14/2012  . Cartilage disease 10/10/2012  . History of left knee surgery 07/08/2012  . Bursitis of hip 09/05/2011    Past Surgical History:  Procedure Laterality Date  . ANTERIOR CERVICAL DECOMP/DISCECTOMY FUSION N/A 07/16/2015   Procedure: CERVICAL FIVE-SIX ANTERIOR CERVICAL DECOMPRESSION/DISCECTOMY FUSION ;  Surgeon: Julio Sicks, MD;  Location: [facility];  Service: Neurosurgery;  Laterality: N/A;  . ANTERIOR CERVICAL DECOMP/DISCECTOMY FUSION N/A 07/28/2016   Procedure: Anterior  Cervical Decompression Fusion Cervical four-five;  Surgeon: Julio Sicks, MD;  Location: [facility] OR;  Service: Neurosurgery;  Laterality: N/A;  . CARPAL TUNNEL RELEASE Bilateral 04/16/2015   second wrist in February  . KNEE ARTHROSCOPY Left    scoped twice  . knee replacement Left    2016    OB History    No data available       Home Medications    Prior to Admission medications   Medication Sig Start Date End Date Taking? Authorizing Provider  ADVAIR DISKUS 250-50 MCG/DOSE AEPB INHALE 1 PUFF INTO THE LUNGS 2 TIMES DAILY Patient not taking: Reported on 07/14/2016 02/14/16   Kerman Passey, MD  albuterol (PROVENTIL HFA;VENTOLIN HFA) 108 (90 Base) MCG/ACT inhaler Inhale 1-2 puffs into the lungs every 6 (six) hours as needed for wheezing or shortness of breath.    [provider]  cyclobenzaprine (FLEXERIL) 10 MG tablet Take 1 tablet (10 mg total) by mouth 3 (three) times daily as needed for muscle spasms. 07/29/16   Ditty, Loura Halt, MD  gabapentin (NEURONTIN) 600 MG tablet 2 (two) Tablet every eight hours, as needed Patient taking differently: Take 600 mg by mouth 3 (three) times daily.  03/09/15   Edwena Felty, MD  Oxycodone HCl 10 MG TABS Take 1-2 tablets (10-20 mg total) by mouth 4 (four) times daily. 07/29/16   Ditty, Loura Halt, MD    Family History Family History  Problem Relation Age of Onset  . Hypertension Mother   . Skin cancer Mother   . Heart disease  Father   . Hypertension Father   . Heart disease Maternal Uncle   . Heart disease Maternal Grandfather   . Breast cancer Maternal Grandmother   . Depression Maternal Grandmother   . Stomach cancer Paternal Grandfather     Social History Social History   Tobacco Use  . Smoking status: Current Some Day Smoker    Packs/day: 1.00    Years: 1.00    Pack years: 1.00    Types: Cigars  . Smokeless tobacco: Never Used  Substance Use Topics  . Alcohol use: No  . Drug use: No     Allergies     Frovatriptan; Sumatriptan succinate; Zolmitriptan; Blue dyes (parenteral); Toradol [ketorolac tromethamine]; Shellfish allergy; Tramadol; and Tylenol [acetaminophen]   Review of Systems Review of Systems  Unable to perform ROS: Mental status change     Physical Exam Updated Vital Signs There were no vitals taken for this visit.  Physical Exam  Constitutional: She appears well-developed and well-nourished. She has a sickly appearance.  Young female responds with retraction to sternal rub, otherwise offers brief inconsistent verbal responses, no details.  HENT:  Head: Normocephalic and atraumatic.  Eyes: Conjunctivae are normal. Right pupil is reactive. Left pupil is reactive.  Bilateral pinpoint pupils patient does not follow neurologic commands  Neck: Neck supple.  Cardiovascular: Normal rate and regular rhythm.  No murmur heard. Pulmonary/Chest: Effort normal and breath sounds normal. No respiratory distress.  Abdominal: Soft. There is no tenderness.  Musculoskeletal: She exhibits no edema.  Neurological: She is unresponsive.  Remedy spontaneously, and response to sternal rub, offers brief verbal responses, has no sustained interactivity.  Skin: Skin is warm and dry.  Psychiatric: Cognition and memory are impaired.  Nursing note and vitals reviewed.    ED Treatments / Results  Labs (all labs ordered are listed, but only abnormal results are displayed) Labs Reviewed  ACETAMINOPHEN LEVEL - Abnormal; Notable for the following components:      Result Value   Acetaminophen (Tylenol), Serum <10 (*)    All other components within normal limits  COMPREHENSIVE METABOLIC PANEL - Abnormal; Notable for the following components:   Total Protein 6.4 (*)    All other components within normal limits  CBG MONITORING, ED - Abnormal; Notable for the following components:   Glucose-Capillary 105 (*)    All other components within normal limits  CBC WITH DIFFERENTIAL/PLATELET  ETHANOL   SALICYLATE LEVEL  RAPID URINE DRUG SCREEN, HOSP PERFORMED  I-STAT BETA HCG BLOOD, ED ([facility], [facility], [facility] ONLY)    EKG  EKG Interpretation  Date/Time:  Tuesday March 27 2017 18:48:47 EST Ventricular Rate:  76 PR Interval:    QRS Duration: 105 QT Interval:  380 QTC Calculation: 428 R Axis:   15 Text Interpretation:  Sinus rhythm Abnormal R-wave progression, early transition Baseline wander in lead(s) V4 V5 Artifact Abnormal ekg Confirmed by Gerhard MunchLockwood, Tarick Parenteau [phone]([id number]) on 03/27/2017 9:36:17 PM       Radiology Ct Head Wo Contrast  Result Date: 03/27/2017 CLINICAL DATA:  Altered level of consciousness. EXAM: CT HEAD WITHOUT CONTRAST TECHNIQUE: Contiguous axial images were obtained from the base of the skull through the vertex without intravenous contrast. COMPARISON:  Head CT 08/10/2015 FINDINGS: Brain: No intracranial hemorrhage, mass effect, or midline shift. No hydrocephalus. The basilar cisterns are patent. No evidence of territorial infarct or acute ischemia. No extra-axial or intracranial fluid collection. Vascular: No hyperdense vessel or unexpected calcification. Skull: No fracture or focal lesion. Sinuses/Orbits: Mucosal thickening throughout the  ethmoid air cells and maxillary sinuses. No sinus fluid levels. Mastoid air cells are clear. Visualized orbits are normal. Other: None. IMPRESSION: 1. Normal intracranial. 2. Mild paranasal sinus mucosal thickening. Electronically Signed   By: Rubye Oaks M.D.   On: 03/27/2017 23:29    Procedures Procedures (including critical care time)  Medications Ordered in ED Medications  naloxone [facility]) injection 0.4 mg (0.4 mg Intravenous Given 03/27/17 1838)     Initial Impression / Assessment and Plan / ED Course  I have reviewed the triage vital signs and the nursing notes.  Pertinent labs & imaging results that were available during my care of the patient were reviewed by me and considered in my medical decision making (see chart for  details).   Immediately after the patient's arrival given concern for possible drug use, given the pinpoint pupils, depressed respiratory effort, the patient received Narcan. This did not result in a sustained change in the patient's condition.   Update:, Patient in similar condition, responds only to sternal rub.  Update: Patient's husband now present. He notes that they recently moved to [place], the patient has been commuting, to work here in [employer]. He denies knowledge of substance abuse issues, alcohol issues. He states that she has had one prior similar episode, with a prolonged period of sleeping afterwards, and eventually recalls that she was told she may have had a seizure. He notes that she is not currently taking any seizure medicine.  Update:, Patient in similar condition, labs unremarkable, urinalysis pending, CT unremarkable.  This young female presents after an episode of altered mental status, with unclear circumstances.  On here the patient is responding only to painful stimuli, but when she does respond she moves all extremity spontaneously and has no facial asymmetry, has brief, marginally intelligible speech. Patient may have had a seizure, or other intoxicant. With otherwise reassuring findings, no evidence for new intracranial pathology, sepsis, bacteremia. Patient was started on Keppra given her history of prior seizure, and when she awakens, if she is ambulatory, she is appropriate for discharge with outpatient neurology follow-up.   Final Clinical Impressions(s) / ED Diagnoses  Altered mental status   Gerhard Munch, MD 03/28/17 [phone]

## 2017-03-27 NOTE — ED Triage Notes (Signed)
Pt dropped off by friend for possible overdose. Friend "happened to have narcan" and gave pt 2mg  IN. Pt able to state her name, will not answer other questions.

## 2017-03-27 NOTE — ED Notes (Signed)
Husband at bedside states the pt was staying at a friends house last night [place] but her [place]. Pts friend reports the pt went to bed last night and when she woke up this morning she hasn't been acting her normal since. Husband reports the pt takes gabapentin and oxycodone at home. Unknown of OD or ETOH intake

## 2017-03-27 NOTE — ED Notes (Signed)
Pt in CT.

## 2017-03-28 LAB — RAPID URINE DRUG SCREEN, HOSP PERFORMED
Amphetamines: NOT DETECTED
BARBITURATES: NOT DETECTED
BENZODIAZEPINES: POSITIVE — AB
Cocaine: NOT DETECTED
Opiates: POSITIVE — AB
TETRAHYDROCANNABINOL: NOT DETECTED

## 2017-03-28 MED ORDER — SODIUM CHLORIDE 0.9 % IV SOLN
500.0000 mg | Freq: Two times a day (BID) | INTRAVENOUS | Status: DC
  Administered 2017-03-28: 500 mg via INTRAVENOUS
  Filled 2017-03-28 (×2): qty 5

## 2017-03-28 MED ORDER — LEVETIRACETAM 500 MG PO TABS: 500.0000 mg | ORAL_TABLET | Freq: Two times a day (BID) | ORAL | 0 refills | Status: AC

## 2017-03-28 NOTE — ED Provider Notes (Signed)
1:51 AM Awakens easily. Reports taking 30-40mg  of oxycodone. This is more than she normally takes. Denies benzo use, however, UDS is positive for benzos in ER. Will attempt to ambulate in hall and discharge home. AMS secondary to polysubstance abuse   Azalia Bilisampos, Travonte Byard, MD 03/28/17 [phone]

## 2017-03-28 NOTE — Discharge Instructions (Signed)
As discussed, it is important he follow-up with both your physician and our neurologist for further evaluation and to today's episode. You may have experienced a seizure. Please take all medication as directed.  Return here for concerning changes in your condition.

## 2017-03-28 NOTE — ED Notes (Signed)
Pt ambulated in unsteady gait in hallway to bathroom

## 2017-03-28 NOTE — ED Notes (Signed)
Pt starting to open her eyes more, moving around in bed

## 2017-04-21 DIAGNOSIS — F1994 Other psychoactive substance use, unspecified with psychoactive substance-induced mood disorder: Secondary | ICD-10-CM | POA: Insufficient documentation

## 2017-04-23 DIAGNOSIS — F1190 Opioid use, unspecified, uncomplicated: Secondary | ICD-10-CM | POA: Insufficient documentation

## 2017-04-23 DIAGNOSIS — F1199 Opioid use, unspecified with unspecified opioid-induced disorder: Secondary | ICD-10-CM | POA: Insufficient documentation

## 2017-04-26 MED ORDER — THIAMINE HCL 100 MG PO TABS
200.00 mg | ORAL_TABLET | ORAL | Status: DC
Start: 2017-04-26 — End: 2017-04-26

## 2017-04-26 MED ORDER — GABAPENTIN 300 MG PO CAPS
600.00 mg | ORAL_CAPSULE | ORAL | Status: DC
Start: 2017-04-26 — End: 2017-04-26

## 2017-04-26 MED ORDER — IRON PO
1.00 | ORAL | Status: DC
Start: 2017-04-27 — End: 2017-04-26

## 2017-04-26 MED ORDER — GENERIC EXTERNAL MEDICATION
2.00 | Status: DC
Start: ? — End: 2017-04-26

## 2017-04-26 MED ORDER — SODIUM CHLORIDE 0.65 % NA SOLN
1.00 | NASAL | Status: DC
Start: ? — End: 2017-04-26

## 2017-04-26 MED ORDER — POLYETHYLENE GLYCOL 3350 17 G PO PACK
17.00 | PACK | ORAL | Status: DC
Start: ? — End: 2017-04-26

## 2017-04-26 MED ORDER — GENERIC EXTERNAL MEDICATION
30.00 | Status: DC
Start: ? — End: 2017-04-26

## 2017-04-26 MED ORDER — MELATONIN 3 MG PO TABS
3.00 mg | ORAL_TABLET | ORAL | Status: DC
Start: ? — End: 2017-04-26

## 2017-04-26 MED ORDER — CYCLOBENZAPRINE HCL 10 MG PO TABS
10.00 mg | ORAL_TABLET | ORAL | Status: DC
Start: ? — End: 2017-04-26

## 2017-04-26 MED ORDER — IBUPROFEN 400 MG PO TABS
400.00 mg | ORAL_TABLET | ORAL | Status: DC
Start: ? — End: 2017-04-26

## 2017-04-26 MED ORDER — HYDROXYZINE HCL 25 MG PO TABS
25.00 mg | ORAL_TABLET | ORAL | Status: DC
Start: ? — End: 2017-04-26

## 2017-04-26 MED ORDER — DICYCLOMINE HCL 10 MG PO CAPS
20.00 mg | ORAL_CAPSULE | ORAL | Status: DC
Start: ? — End: 2017-04-26

## 2017-05-01 ENCOUNTER — Encounter: Payer: Self-pay | Admitting: Family Medicine

## 2017-06-06 IMAGING — MR MR CERVICAL SPINE W/O CM
5 series · 34 of 48 positions shown · non-contrast
Comparison: 06/19/2014

CLINICAL DATA: Neck pain, bilateral arm pain, numbness

EXAM:
MRI CERVICAL SPINE WITHOUT CONTRAST
TECHNIQUE: Multiplanar, multisequence MR imaging of the cervical spine was
performed. No intravenous contrast was administered.

[Series 2: T2 · sagittal · 3.0mm · 0.56mm/px · 8 of 13 slices shown (1 of 2)]
[im 1/13]
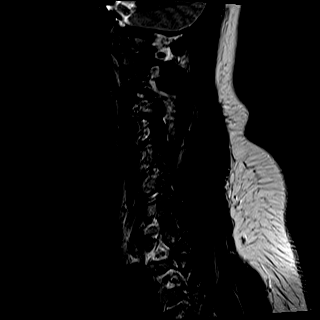
[im 2/13]
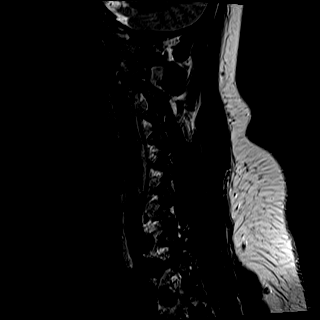
[im 4/13]
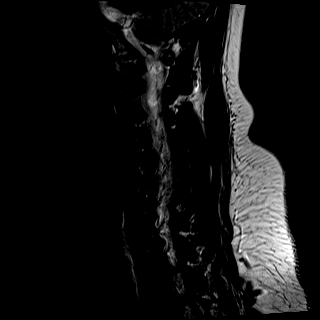
[im 6/13]
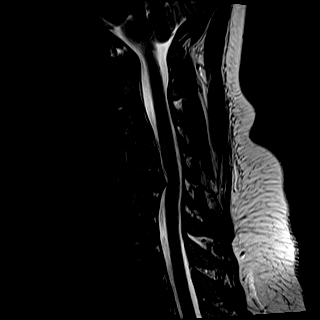
[im 7/13]
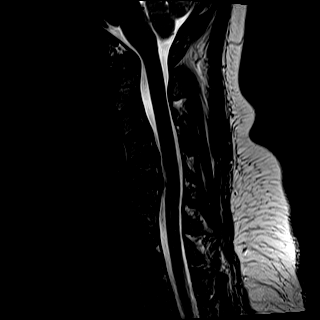
[im 9/13]
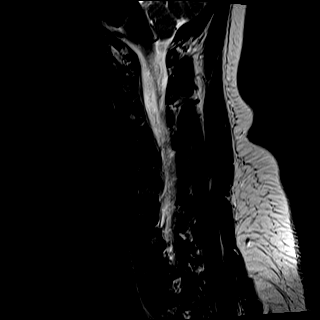
[im 11/13]
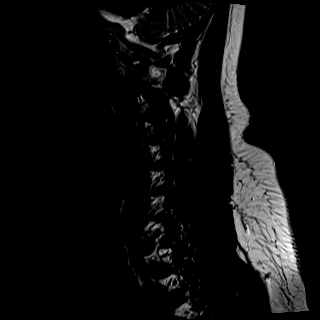
[im 13/13]
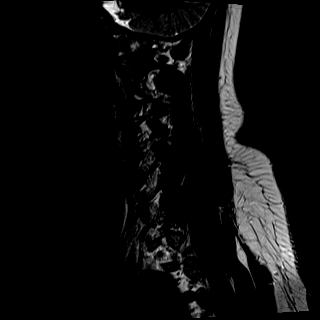

[Series 3: T1 · sagittal · 3.0mm · 0.70mm/px · 7 of 13 slices shown]
[im 1/13]
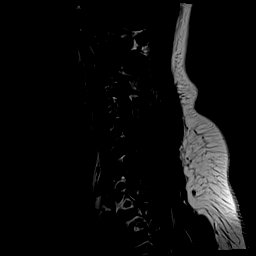
[im 3/13]
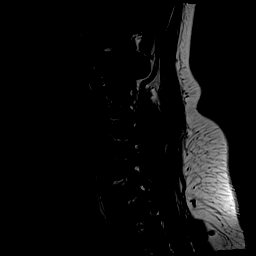
[im 5/13]
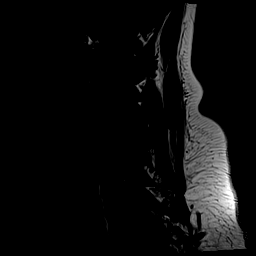
[im 7/13]
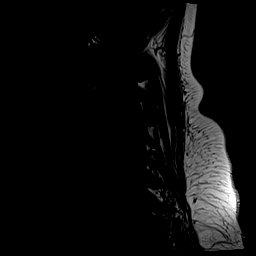
[im 9/13]
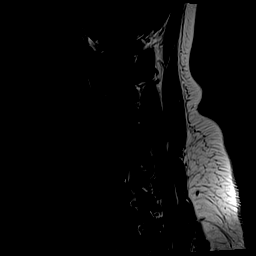
[im 11/13]
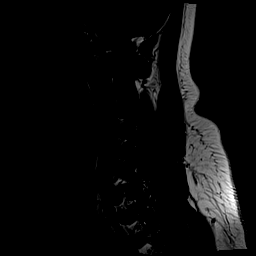
[im 13/13]
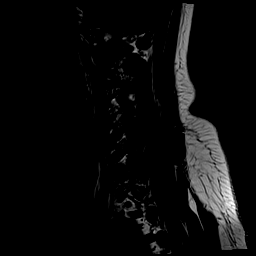

[Series 4: STIR · sagittal · 3.0mm · 0.35mm/px · 7 of 13 slices shown]
[im 1/13]
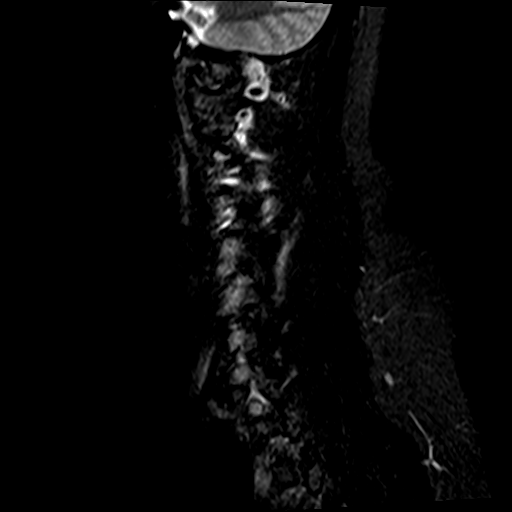
[im 3/13]
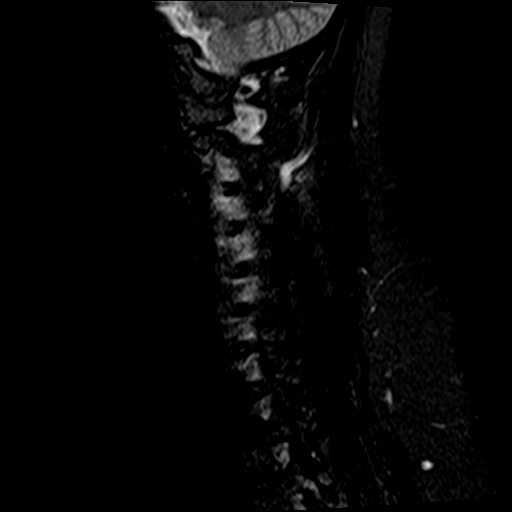
[im 5/13]
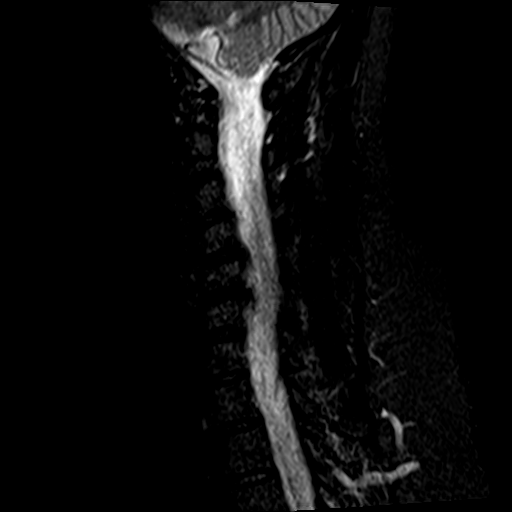
[im 7/13]
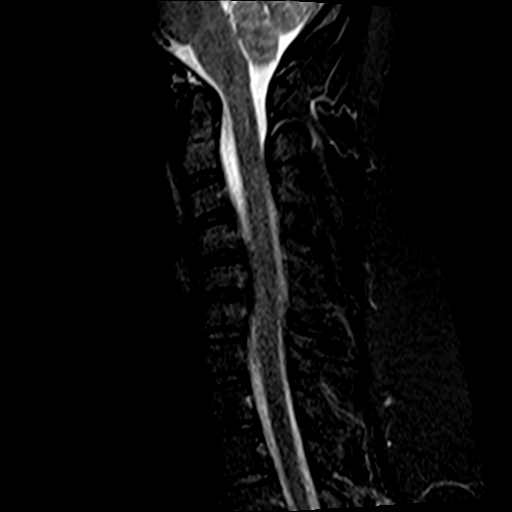
[im 9/13]
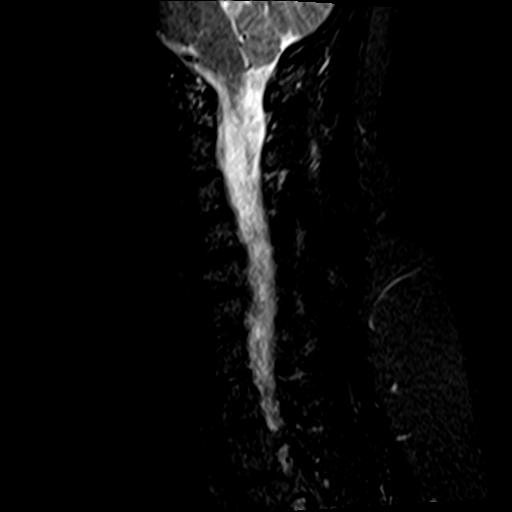
[im 11/13]
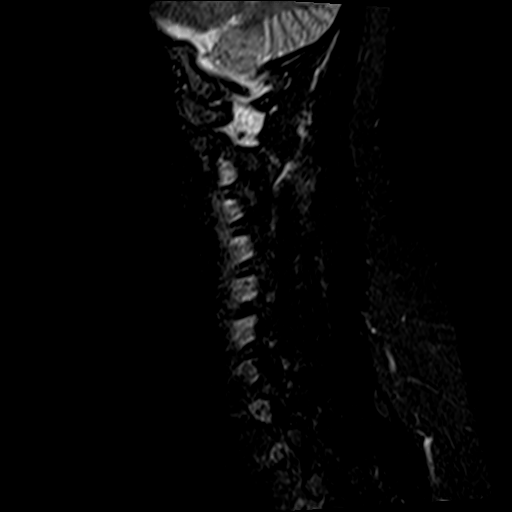
[im 13/13]
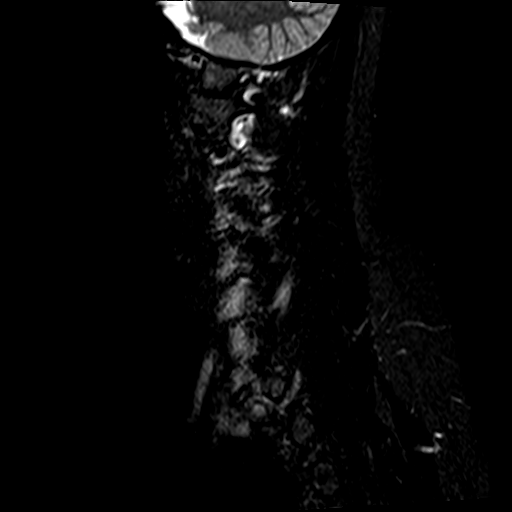

[Series 6: mpgr ax · axial · 3.0mm · 0.35mm/px · z∈[-48,-21]mm · 3 of 23 slices shown]
[im 1/23]
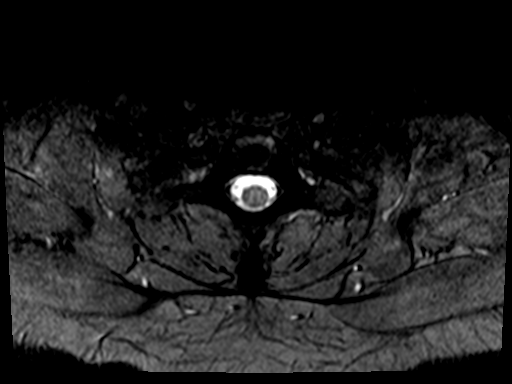
[im 4/23]
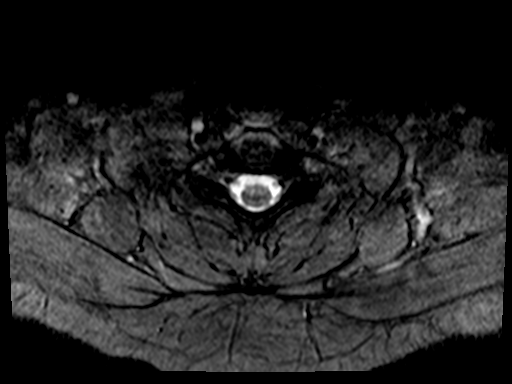
[im 8/23]
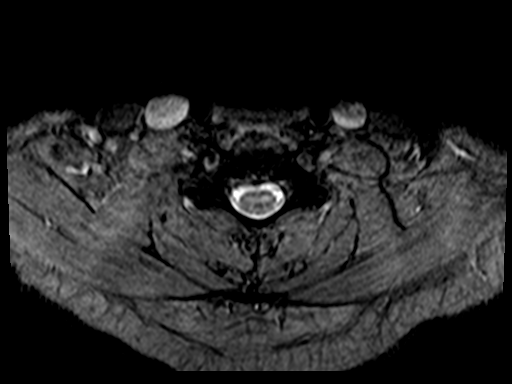

[Series 100: T2 · axial · 3.0mm · 0.62mm/px · z∈[-54,+31]mm · 9 of 23 slices shown (2 of 2)]
[im 1/23]
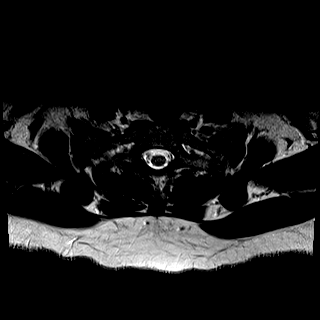
[im 4/23]
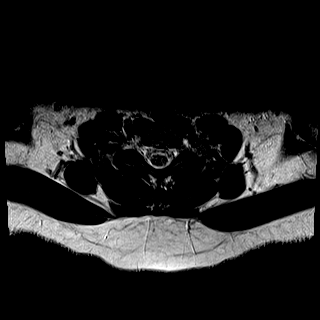
[im 8/23]
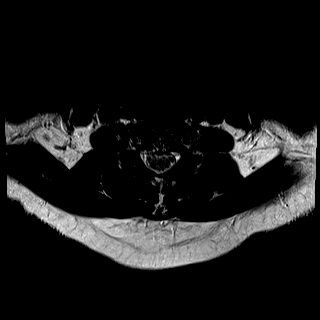
[im 10/23]
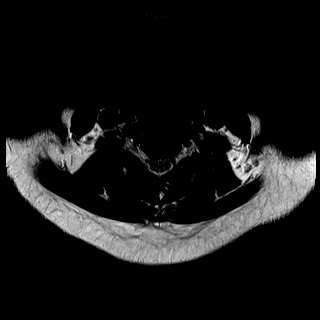
[im 12/23]
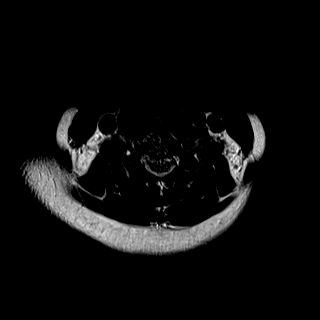
[im 13/23]
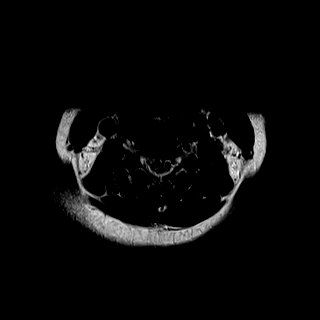
[im 15/23]
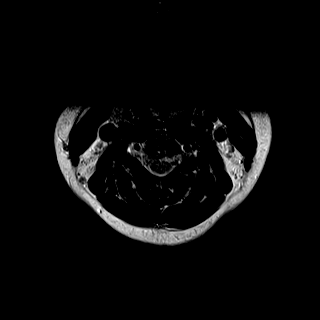
[im 19/23]
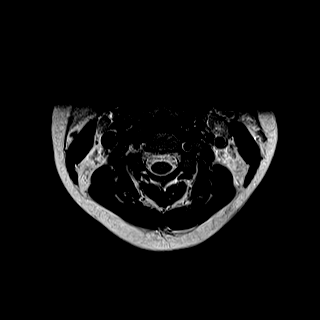
[im 23/23]
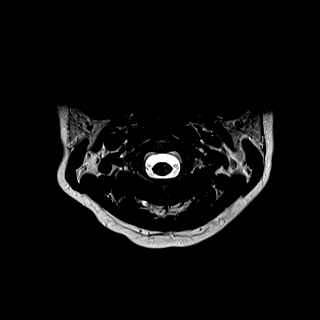

[34 of 48 positions shown; findings below may reference images not displayed]

FINDINGS: Alignment: Loss of the normal cervical lordosis with mild kyphosis
centered at C5-6. No static listhesis.

Bones: Vertebral body heights are maintained. Bone marrow signal is
normal.

Spinal cord:  Cervical cord is normal in size and signal.

Posterior fossa: No focal abnormality.

Vessels:  Normal flow voids are present.

Paraspinal tissues:  No focal abnormality.

Disc levels:

Discs: Mild degenerative disc disease at C5-6.

C2-3: No significant disc bulge. No neural foraminal stenosis. No
central canal stenosis.

C3-4: No significant disc bulge. No neural foraminal stenosis. No
central canal stenosis.

C4-5: Shallow left paracentral disc protrusion. No neural foraminal
stenosis. No central canal stenosis.

C5-6: Right paracentral disc protrusion abutting the ventral right
paracentral cervical spinal cord. No neural foraminal stenosis. No
central canal stenosis.

C6-7: No significant disc bulge. No neural foraminal stenosis. No
central canal stenosis.

C7-T1: No significant disc bulge. No neural foraminal stenosis. No
central canal stenosis.
IMPRESSION: 1. At C5-6 there is a right paracentral disc protrusion abutting the
ventral right paracentral cervical spinal cord.
2. At C4-5 there is a shallow left paracentral disc protrusion.

## 2019-09-18 DIAGNOSIS — Z419 Encounter for procedure for purposes other than remedying health state, unspecified: Secondary | ICD-10-CM | POA: Diagnosis not present

## 2019-10-19 DIAGNOSIS — Z419 Encounter for procedure for purposes other than remedying health state, unspecified: Secondary | ICD-10-CM | POA: Diagnosis not present

## 2019-11-19 DIAGNOSIS — Z419 Encounter for procedure for purposes other than remedying health state, unspecified: Secondary | ICD-10-CM | POA: Diagnosis not present

## 2019-12-19 DIAGNOSIS — Z419 Encounter for procedure for purposes other than remedying health state, unspecified: Secondary | ICD-10-CM | POA: Diagnosis not present

## 2020-01-19 DIAGNOSIS — Z419 Encounter for procedure for purposes other than remedying health state, unspecified: Secondary | ICD-10-CM | POA: Diagnosis not present

## 2020-02-18 DIAGNOSIS — Z419 Encounter for procedure for purposes other than remedying health state, unspecified: Secondary | ICD-10-CM | POA: Diagnosis not present

## 2020-03-20 DIAGNOSIS — Z419 Encounter for procedure for purposes other than remedying health state, unspecified: Secondary | ICD-10-CM | POA: Diagnosis not present

## 2020-04-20 DIAGNOSIS — Z419 Encounter for procedure for purposes other than remedying health state, unspecified: Secondary | ICD-10-CM | POA: Diagnosis not present

## 2020-05-18 DIAGNOSIS — Z419 Encounter for procedure for purposes other than remedying health state, unspecified: Secondary | ICD-10-CM | POA: Diagnosis not present

## 2020-06-18 DIAGNOSIS — Z419 Encounter for procedure for purposes other than remedying health state, unspecified: Secondary | ICD-10-CM | POA: Diagnosis not present

## 2020-07-18 DIAGNOSIS — Z419 Encounter for procedure for purposes other than remedying health state, unspecified: Secondary | ICD-10-CM | POA: Diagnosis not present

## 2020-08-18 DIAGNOSIS — Z419 Encounter for procedure for purposes other than remedying health state, unspecified: Secondary | ICD-10-CM | POA: Diagnosis not present

## 2020-09-17 DIAGNOSIS — Z419 Encounter for procedure for purposes other than remedying health state, unspecified: Secondary | ICD-10-CM | POA: Diagnosis not present

## 2020-10-18 DIAGNOSIS — Z419 Encounter for procedure for purposes other than remedying health state, unspecified: Secondary | ICD-10-CM | POA: Diagnosis not present

## 2020-11-18 DIAGNOSIS — Z419 Encounter for procedure for purposes other than remedying health state, unspecified: Secondary | ICD-10-CM | POA: Diagnosis not present

## 2020-12-18 DIAGNOSIS — Z419 Encounter for procedure for purposes other than remedying health state, unspecified: Secondary | ICD-10-CM | POA: Diagnosis not present

## 2021-01-18 DIAGNOSIS — Z419 Encounter for procedure for purposes other than remedying health state, unspecified: Secondary | ICD-10-CM | POA: Diagnosis not present

## 2021-02-17 DIAGNOSIS — Z419 Encounter for procedure for purposes other than remedying health state, unspecified: Secondary | ICD-10-CM | POA: Diagnosis not present

## 2021-03-20 DIAGNOSIS — Z419 Encounter for procedure for purposes other than remedying health state, unspecified: Secondary | ICD-10-CM | POA: Diagnosis not present

## 2021-04-20 DIAGNOSIS — Z419 Encounter for procedure for purposes other than remedying health state, unspecified: Secondary | ICD-10-CM | POA: Diagnosis not present

## 2021-05-18 DIAGNOSIS — Z419 Encounter for procedure for purposes other than remedying health state, unspecified: Secondary | ICD-10-CM | POA: Diagnosis not present

## 2021-06-18 DIAGNOSIS — Z419 Encounter for procedure for purposes other than remedying health state, unspecified: Secondary | ICD-10-CM | POA: Diagnosis not present

## 2021-07-18 DIAGNOSIS — Z419 Encounter for procedure for purposes other than remedying health state, unspecified: Secondary | ICD-10-CM | POA: Diagnosis not present

## 2021-08-18 DIAGNOSIS — Z419 Encounter for procedure for purposes other than remedying health state, unspecified: Secondary | ICD-10-CM | POA: Diagnosis not present

## 2021-09-17 DIAGNOSIS — Z419 Encounter for procedure for purposes other than remedying health state, unspecified: Secondary | ICD-10-CM | POA: Diagnosis not present

## 2021-10-18 DIAGNOSIS — Z419 Encounter for procedure for purposes other than remedying health state, unspecified: Secondary | ICD-10-CM | POA: Diagnosis not present

## 2021-11-18 DIAGNOSIS — Z419 Encounter for procedure for purposes other than remedying health state, unspecified: Secondary | ICD-10-CM | POA: Diagnosis not present

## 2021-12-18 DIAGNOSIS — Z419 Encounter for procedure for purposes other than remedying health state, unspecified: Secondary | ICD-10-CM | POA: Diagnosis not present

## 2021-12-18 DEATH — deceased

## 2022-01-18 DIAGNOSIS — Z419 Encounter for procedure for purposes other than remedying health state, unspecified: Secondary | ICD-10-CM | POA: Diagnosis not present

## 2022-02-17 DIAGNOSIS — Z419 Encounter for procedure for purposes other than remedying health state, unspecified: Secondary | ICD-10-CM | POA: Diagnosis not present

## 2022-03-20 DIAGNOSIS — Z419 Encounter for procedure for purposes other than remedying health state, unspecified: Secondary | ICD-10-CM | POA: Diagnosis not present

## 2022-04-20 DIAGNOSIS — Z419 Encounter for procedure for purposes other than remedying health state, unspecified: Secondary | ICD-10-CM | POA: Diagnosis not present

## 2022-12-04 ENCOUNTER — Telehealth: Payer: Self-pay

## 2022-12-04 NOTE — Telephone Encounter (Signed)
LVM for patient to call back [phone], or to call PCP office to schedule follow up apt. AS, CMA
